# Patient Record
Sex: Female | Born: 1980
Health system: Southern US, Community
[De-identification: ages and names within clinical notes are randomized; demographics above are authoritative.]

## PROBLEM LIST (undated history)

## (undated) DIAGNOSIS — F329 Major depressive disorder, single episode, unspecified: Secondary | ICD-10-CM

## (undated) DIAGNOSIS — D649 Anemia, unspecified: Secondary | ICD-10-CM

## (undated) DIAGNOSIS — G43909 Migraine, unspecified, not intractable, without status migrainosus: Secondary | ICD-10-CM

## (undated) DIAGNOSIS — Z8619 Personal history of other infectious and parasitic diseases: Secondary | ICD-10-CM

## (undated) DIAGNOSIS — F32A Depression, unspecified: Secondary | ICD-10-CM

## (undated) DIAGNOSIS — R51 Headache: Secondary | ICD-10-CM

## (undated) HISTORY — PX: WISDOM TOOTH EXTRACTION: SHX21

## (undated) HISTORY — DX: Migraine, unspecified, not intractable, without status migrainosus: G43.909

## (undated) HISTORY — DX: Anemia, unspecified: D64.9

## (undated) HISTORY — DX: Personal history of other infectious and parasitic diseases: Z86.19

## (undated) HISTORY — DX: Headache: R51

## (undated) HISTORY — PX: MOLE REMOVAL: SHX2046

---
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
## 1998-03-03 ENCOUNTER — Other Ambulatory Visit: Admission: RE | Admit: 1998-03-03 | Discharge: 1998-03-03 | Payer: Self-pay

## 2002-12-22 ENCOUNTER — Ambulatory Visit (HOSPITAL_COMMUNITY): Admission: RE | Admit: 2002-12-22 | Discharge: 2002-12-22 | Payer: Self-pay | Admitting: Plastic Surgery

## 2002-12-22 ENCOUNTER — Ambulatory Visit (HOSPITAL_BASED_OUTPATIENT_CLINIC_OR_DEPARTMENT_OTHER): Admission: RE | Admit: 2002-12-22 | Discharge: 2002-12-22 | Payer: Self-pay | Admitting: Plastic Surgery

## 2002-12-22 ENCOUNTER — Encounter (INDEPENDENT_AMBULATORY_CARE_PROVIDER_SITE_OTHER): Payer: Self-pay | Admitting: Specialist

## 2003-04-17 ENCOUNTER — Ambulatory Visit (HOSPITAL_COMMUNITY): Admission: RE | Admit: 2003-04-17 | Discharge: 2003-04-17 | Payer: Self-pay | Admitting: Family Medicine

## 2005-01-12 ENCOUNTER — Other Ambulatory Visit: Admission: RE | Admit: 2005-01-12 | Discharge: 2005-01-12 | Payer: Self-pay | Admitting: Obstetrics and Gynecology

## 2005-01-23 ENCOUNTER — Ambulatory Visit (HOSPITAL_COMMUNITY): Admission: RE | Admit: 2005-01-23 | Discharge: 2005-01-23 | Payer: Self-pay | Admitting: Internal Medicine

## 2006-03-30 ENCOUNTER — Ambulatory Visit: Payer: Self-pay | Admitting: Internal Medicine

## 2006-04-23 ENCOUNTER — Encounter: Payer: Self-pay | Admitting: Cardiology

## 2006-04-23 ENCOUNTER — Ambulatory Visit: Payer: Self-pay

## 2006-04-23 ENCOUNTER — Ambulatory Visit: Payer: Self-pay | Admitting: Internal Medicine

## 2007-07-02 ENCOUNTER — Ambulatory Visit (HOSPITAL_BASED_OUTPATIENT_CLINIC_OR_DEPARTMENT_OTHER): Admission: RE | Admit: 2007-07-02 | Discharge: 2007-07-02 | Payer: Self-pay | Admitting: Plastic Surgery

## 2007-07-02 ENCOUNTER — Encounter (INDEPENDENT_AMBULATORY_CARE_PROVIDER_SITE_OTHER): Payer: Self-pay | Admitting: Plastic Surgery

## 2007-11-02 ENCOUNTER — Emergency Department (HOSPITAL_COMMUNITY): Admission: EM | Admit: 2007-11-02 | Discharge: 2007-11-02 | Payer: Self-pay | Admitting: Family Medicine

## 2007-11-18 ENCOUNTER — Inpatient Hospital Stay (HOSPITAL_COMMUNITY): Admission: AD | Admit: 2007-11-18 | Discharge: 2007-11-21 | Payer: Self-pay | Admitting: Obstetrics and Gynecology

## 2008-11-14 ENCOUNTER — Emergency Department (HOSPITAL_COMMUNITY): Admission: EM | Admit: 2008-11-14 | Discharge: 2008-11-15 | Payer: Self-pay | Admitting: Emergency Medicine

## 2009-05-05 ENCOUNTER — Ambulatory Visit (HOSPITAL_BASED_OUTPATIENT_CLINIC_OR_DEPARTMENT_OTHER): Admission: RE | Admit: 2009-05-05 | Discharge: 2009-05-05 | Payer: Self-pay | Admitting: Plastic Surgery

## 2009-06-15 ENCOUNTER — Ambulatory Visit (HOSPITAL_BASED_OUTPATIENT_CLINIC_OR_DEPARTMENT_OTHER): Admission: RE | Admit: 2009-06-15 | Discharge: 2009-06-15 | Payer: Self-pay | Admitting: Plastic Surgery

## 2010-08-05 NOTE — Op Note (Signed)
NAME:  Morgan Garrett, Morgan Garrett                          ACCOUNT NO.:  [id number]   MEDICAL RECORD NO.:  [id number]                   PATIENT TYPE:  AMB   LOCATION:  DSC                                  FACILITY:  [facility]   PHYSICIAN:  Brantley Persons, M.D.             DATE OF BIRTH:  [birth year]   DATE OF PROCEDURE:  12/22/2002  DATE OF DISCHARGE:                                 OPERATIVE REPORT   PREOPERATIVE DIAGNOSIS:  Suspicious skin lesion, right sternum.   POSTOPERATIVE DIAGNOSIS:  Suspicious skin lesion, right sternum.   OPERATION PERFORMED:  1. Excision of 1.2 cm suspicious skin lesion, right sternum.  2. Intermediate closure of 2.7 cm right sternal incision.   SURGEON:  Brantley Persons, M.D.   ANESTHESIA:  1% lidocaine with epinephrine.   COMPLICATIONS:  None.   INDICATIONS FOR PROCEDURE:  The patient is a 30 year old Caucasian female  who has been referred by Dr. Elmon Else for evaluation of a right sternal  skin lesion.  This skin lesion has an irregular border as well as  coloration.  I agreed that the patient should undergo an excisional biopsy.  She therefore presents to undergo that excision.   DESCRIPTION OF PROCEDURE:  The patient was brought back into the minor room  and placed on the table in the supine position.  The sternal area was then  prepped with Betadine and draped in sterile fashion.  The skin and  subcutaneous tissues in the area of the suspicious skin lesion were injected  with 1% lidocaine with epinephrine.  After adequate hemostasis and  anesthesia had taken effect, the procedure was begun.  Using loupe  magnification, the borders of the suspicious skin lesion were identified.  At least 1 mm margins were marked circumferentially around the skin lesion.  The skin lesion was then excised full thickness through the skin into the  subcutaneous tissue with a knife.  The specimen was marked at the 12 o'clock  position and passed off the table to undergo  permanent pathologic section  evaluation.  The skin edges were then undermined for easier closure.  An  intermediate closure was performed.  The deeper subcutaneous tissues were  closed with 3-0 Monocryl suture.  Deep dermal layer was then also closed  with 3-0 Monocryl suture.  The skin was then closed with a 4-0 Monocryl  running intracuticular stitch on the skin.  The incision was dressed with  Steri-Strips.  There were no complications.  The patient tolerated the  procedure well.  She was then taught proper postoperative wound care  instructions and discharged home in stable condition.  Follow-up appointment  will be tomorrow in the office.  Brantley Persons, M.D.    MC/MEDQ  D:  12/22/2002  T:  12/23/2002  Job:  [id number]

## 2010-08-05 NOTE — Procedures (Signed)
[name]                              EXERCISE TREADMILL   GUISELLE, MIAN                       MRN:          [id number]  DATE:04/23/2006                            DOB:          [birth year]    REFERRING PHYSICIAN:  Kari Baars, M.D.   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  She had an  echocardiogram today which showed a normal LV function with an ejection  fraction of 65%, no valvular abnormalities.  There was no mitral valve  prolapse.   RESTING DATA:  EKG showed a normal sinus rhythm at a rate of 96 with no  ST-T wave abnormalities.   EXERCISE DATA:  Patient exercised for 10 minutes and 30 seconds on a  standard Bruce protocol.  She stopped the test due to fatigue and  dyspnea, there was no chest pain.  Peak workload was 12.5 METS.  Peak  heart rate was 196, which is 101% of age predicted maximal heart rate.  There was no ST-T wave changes with exercise.  Resting blood pressure  went from 112/70 to 162/65.  Of note, she did have quite an abrupt  increase in her heart rate with the initiation of exercise, going from  96 at rest to 121 in the first 30 seconds of exercise.   CONCLUSION:  1. Normal exercise treadmill test.  2. Brisk heart rate increase with exercise which is within normal      limits.  3. I suspect she is mildly deconditioned and suggested that she      continue with her running program and also discussed with her a      possible small amount of interval training.  I also suspect that      she is a person who typically has a high sympathetic tone and will      likely always have a brisk heart rate response to exercise.   She will follow back up with [place] on a p.r.n. basis.     Bevelyn Buckles. Bensimhon, MD  Electronically Signed    DRB/MedQ  DD: 04/23/2006  DT: 04/23/2006   Job #: [id number]   cc:   Kari Baars, M.D.

## 2010-08-05 NOTE — Assessment & Plan Note (Signed)
[facility]                            CARDIOLOGY OFFICE NOTE   NAIARA, Garrett                       MRN:          [id number]  DATE:03/30/2006                            DOB:          06/05/1980    REASON FOR CONSULT:  Exercise-related tachycardia.   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  She really has no significant  past medical history except for some mild depression.  She does have a  sister who had a history of Wolff-Parkinson-White syndrome and underwent  ablation.  There is no other family history of cardiac disease.  She  tells me that she has always noted that her heart rate has been faster  than most people when exercising, but as a kid she never had problems  keeping up with other kids or any other cardiopulmonary problems.  Over  the past few months she has realized that her heart rate has been faster  than normal and this feels somewhat uncomfortable, and she often feels  like her heart is bounding.  There is no necessarily chest pain or  shortness of breath with this.  She does teach water aerobics.  She used  to run and do the elliptical but stopped this a few months ago because  she thought her heart rate was getting worse.  She also stopped her  Wellbutrin to see if this would help and there was no effect.  Additionally, she also cut back on her caffeine, though she was never  really a heavy caffeine drinker, and this has not really helped either.  She denies any syncope or presyncope.  She was told that she had a heart  murmur as a child but told that this was okay.  She saw Dr. Clelia Croft  yesterday who checked a TSH and the result is pending.  She is referred  here for further evaluation.   She denies any overwhelming stress but is currently planning her  wedding.   REVIEW OF SYSTEMS:  She does note some fatigue and anemia, as well as  depression and  migraines.  She denies the possibility of being pregnant  as she is currently on her period.  The remainder of review of systems  is negative except for HPI and problem list.   PAST MEDICAL HISTORY:  1. History of heart murmur as a child, thought to be benign.  2. Migraine headaches.  3. Depression.   CURRENT MEDICATIONS:  Relpax for her migraines.   SOCIAL HISTORY:  She works as an Mining engineer at [employer].  She is  single, in the process of getting married.  Denies any tobacco.  Alcohol  use only occasionally.   FAMILY HISTORY:  Mother is 85 and alive and well.  Father 76, alive and  well.  Sister 20 is well.  Does have a history of Wolff-Parkinson-White  status post ablation at 44 years old.  Brother is 32 and well.   PHYSICAL EXAMINATION:  GENERAL:  She is a well-appearing woman in no  acute  distress.  Ambulates around the clinic without any respiratory  difficulty.  VITAL SIGNS:  Blood pressure is 106/74, heart rate 77, her weight is  143.  HEENT:  Sclerae anicteric, EOMI.  There is no xanthelasma.  Mucous  membranes are moist.  NECK:  Supple.  No JVD.  Carotids are 2+ bilaterally without bruits.  There is no lymphadenopathy or thyromegaly.  CARDIAC:  She has a regular rate and rhythm with a soft flow murmur  across her LV outflow tract.  There is no change with maneuvers.  There  is no rub or gallop.  LUNGS:  Clear.  ABDOMEN:  Soft, nontender, nondistended.  No hepatosplenomegaly, no  bruits, no masses.  EXTREMITIES:  Warm with no cyanosis, clubbing or edema.  Good distal  pulses.  There are no rashes or arthropathies.  NEUROLOGIC:  She is alert and oriented x3.  Cranial nerves II-XII are  intact.  Moves all four extremities without difficulty.  Affect is  bright.   EKG shows normal sinus rhythm with sinus arrhythmia at a rate of 77.  Her PR interval is 126 milliseconds, QRS duration is 82 milliseconds, QT  interval is normal.  There is no evidence of  preexcitation.   ASSESSMENT AND PLAN:  Exercise-induced tachycardia.  I suspect this is  just a normal variant and not pathologic.  However, I do think it is  reasonable to perform an echocardiogram to make sure her LV function and  structure is normal.  We will also put her on the treadmill and evaluate  her heart rate response.  Should there be any remaining question, I do  think it would be reasonable to place a monitor to further evaluate.  We  will await her TSH results.   DISPOSITION:  Pending the results of her initial testing.     Bevelyn Buckles. Bensimhon, MD  Electronically Signed    DRB/MedQ  DD: 03/30/2006  DT: 03/30/2006  Job #: [id number]   cc:   Kari Baars, M.D.

## 2010-12-21 LAB — CBC
HCT: 35.5 — ABNORMAL LOW
HCT: 37.3
Hemoglobin: 12.2
Hemoglobin: 12.9
MCHC: 34.4
MCHC: 34.5
MCV: 90.6
MCV: 92.1
Platelets: 134 — ABNORMAL LOW
Platelets: 183
RBC: 3.86 — ABNORMAL LOW
RBC: 4.12
RDW: 11.8
RDW: 12
WBC: 10.2
WBC: 10.9 — ABNORMAL HIGH

## 2010-12-21 LAB — RPR: RPR Ser Ql: NONREACTIVE

## 2011-05-16 ENCOUNTER — Other Ambulatory Visit: Payer: Self-pay | Admitting: Obstetrics and Gynecology

## 2011-08-18 LAB — OB RESULTS CONSOLE ANTIBODY SCREEN: Antibody Screen: NEGATIVE

## 2011-08-18 LAB — OB RESULTS CONSOLE GC/CHLAMYDIA
Chlamydia: NEGATIVE
Gonorrhea: NEGATIVE

## 2011-08-18 LAB — OB RESULTS CONSOLE RPR: RPR: NONREACTIVE

## 2011-08-18 LAB — OB RESULTS CONSOLE ABO/RH: RH Type: NEGATIVE

## 2011-08-18 LAB — OB RESULTS CONSOLE HEPATITIS B SURFACE ANTIGEN: Hepatitis B Surface Ag: NEGATIVE

## 2011-08-18 LAB — OB RESULTS CONSOLE HIV ANTIBODY (ROUTINE TESTING): HIV: NONREACTIVE

## 2011-08-18 LAB — OB RESULTS CONSOLE RUBELLA ANTIBODY, IGM: Rubella: IMMUNE

## 2012-02-19 LAB — OB RESULTS CONSOLE GBS: GBS: NEGATIVE

## 2012-03-08 ENCOUNTER — Telehealth (HOSPITAL_COMMUNITY): Payer: Self-pay | Admitting: *Deleted

## 2012-03-08 ENCOUNTER — Encounter (HOSPITAL_COMMUNITY): Payer: Self-pay | Admitting: *Deleted

## 2012-03-08 NOTE — Telephone Encounter (Signed)
Preadmission screen  

## 2012-03-10 ENCOUNTER — Inpatient Hospital Stay (HOSPITAL_COMMUNITY): Payer: 59 | Admitting: Anesthesiology

## 2012-03-10 ENCOUNTER — Encounter (HOSPITAL_COMMUNITY): Payer: Self-pay | Admitting: *Deleted

## 2012-03-10 ENCOUNTER — Encounter (HOSPITAL_COMMUNITY): Payer: Self-pay | Admitting: Anesthesiology

## 2012-03-10 ENCOUNTER — Inpatient Hospital Stay (HOSPITAL_COMMUNITY)
Admission: AD | Admit: 2012-03-10 | Discharge: 2012-03-12 | DRG: 775 | Disposition: A | Discharge status: Home / Self Care | Payer: 59 | Source: Ambulatory Visit | Attending: Obstetrics and Gynecology | Admitting: Obstetrics and Gynecology

## 2012-03-10 DIAGNOSIS — O99893 Other specified diseases and conditions complicating puerperium: Principal | ICD-10-CM | POA: Diagnosis not present

## 2012-03-10 DIAGNOSIS — R209 Unspecified disturbances of skin sensation: Secondary | ICD-10-CM | POA: Diagnosis not present

## 2012-03-10 HISTORY — DX: Major depressive disorder, single episode, unspecified: F32.9

## 2012-03-10 HISTORY — DX: Depression, unspecified: F32.A

## 2012-03-10 LAB — CBC
HCT: 37.8 % (ref 36.0–46.0)
MCHC: 34.7 g/dL (ref 30.0–36.0)
Platelets: 170 10*3/uL (ref 150–400)
RDW: 13 % (ref 11.5–15.5)
WBC: 13.4 10*3/uL — ABNORMAL HIGH (ref 4.0–10.5)

## 2012-03-10 MED ORDER — ZOLPIDEM TARTRATE 5 MG PO TABS: 5.0000 mg | ORAL_TABLET | Freq: Every evening | ORAL | Status: DC | PRN

## 2012-03-10 MED ORDER — FENTANYL 2.5 MCG/ML BUPIVACAINE 1/10 % EPIDURAL INFUSION (WH - ANES)
14.0000 mL/h | INTRAMUSCULAR | Status: DC
  Administered 2012-03-10: 14 mL/h via EPIDURAL
  Filled 2012-03-10: qty 125

## 2012-03-10 MED ORDER — ACETAMINOPHEN 325 MG PO TABS: 650.0000 mg | ORAL_TABLET | ORAL | Status: DC | PRN

## 2012-03-10 MED ORDER — OXYCODONE-ACETAMINOPHEN 5-325 MG PO TABS: 1.0000 | ORAL_TABLET | ORAL | Status: DC | PRN

## 2012-03-10 MED ORDER — ONDANSETRON HCL 4 MG PO TABS: 4.0000 mg | ORAL_TABLET | ORAL | Status: DC | PRN

## 2012-03-10 MED ORDER — OXYCODONE-ACETAMINOPHEN 5-325 MG PO TABS
1.0000 | ORAL_TABLET | ORAL | Status: DC | PRN
  Administered 2012-03-11: 1 via ORAL
  Filled 2012-03-10: qty 1

## 2012-03-10 MED ORDER — PHENYLEPHRINE 40 MCG/ML (10ML) SYRINGE FOR IV PUSH (FOR BLOOD PRESSURE SUPPORT)
80.0000 ug | PREFILLED_SYRINGE | INTRAVENOUS | Status: AC | PRN
  Administered 2012-03-10: 120 ug via INTRAVENOUS
  Administered 2012-03-10 (×2): 80 ug via INTRAVENOUS
  Filled 2012-03-10 (×2): qty 5

## 2012-03-10 MED ORDER — SODIUM BICARBONATE 8.4 % IV SOLN
INTRAVENOUS | Status: DC | PRN
  Administered 2012-03-10: 5 mL via EPIDURAL

## 2012-03-10 MED ORDER — TETANUS-DIPHTH-ACELL PERTUSSIS 5-2.5-18.5 LF-MCG/0.5 IM SUSP: 0.5000 mL | Freq: Once | INTRAMUSCULAR | Status: DC

## 2012-03-10 MED ORDER — FLEET ENEMA 7-19 GM/118ML RE ENEM: 1.0000 | ENEMA | Freq: Every day | RECTAL | Status: DC | PRN

## 2012-03-10 MED ORDER — BENZOCAINE-MENTHOL 20-0.5 % EX AERO
1.0000 "application " | INHALATION_SPRAY | CUTANEOUS | Status: DC | PRN
  Administered 2012-03-10: 1 via TOPICAL
  Filled 2012-03-10: qty 56

## 2012-03-10 MED ORDER — DIPHENHYDRAMINE HCL 25 MG PO CAPS
25.0000 mg | ORAL_CAPSULE | Freq: Four times a day (QID) | ORAL | Status: DC | PRN
Start: 2012-03-10 — End: 2012-03-12

## 2012-03-10 MED ORDER — EPHEDRINE 5 MG/ML INJ
10.0000 mg | INTRAVENOUS | Status: DC | PRN
  Filled 2012-03-10: qty 4

## 2012-03-10 MED ORDER — CITRIC ACID-SODIUM CITRATE 334-500 MG/5ML PO SOLN: 30.0000 mL | ORAL | Status: DC | PRN

## 2012-03-10 MED ORDER — LACTATED RINGERS IV SOLN
500.0000 mL | Freq: Once | INTRAVENOUS | Status: AC
  Administered 2012-03-10: 500 mL via INTRAVENOUS

## 2012-03-10 MED ORDER — EPHEDRINE 5 MG/ML INJ: 10.0000 mg | INTRAVENOUS | Status: DC | PRN

## 2012-03-10 MED ORDER — DIPHENHYDRAMINE HCL 50 MG/ML IJ SOLN: 12.5000 mg | INTRAMUSCULAR | Status: DC | PRN

## 2012-03-10 MED ORDER — FLEET ENEMA 7-19 GM/118ML RE ENEM: 1.0000 | ENEMA | RECTAL | Status: DC | PRN

## 2012-03-10 MED ORDER — OXYTOCIN 40 UNITS IN LACTATED RINGERS INFUSION - SIMPLE MED
62.5000 mL/h | INTRAVENOUS | Status: DC
  Administered 2012-03-10: 62.5 mL/h via INTRAVENOUS
  Filled 2012-03-10: qty 1000

## 2012-03-10 MED ORDER — ONDANSETRON HCL 4 MG/2ML IJ SOLN
4.0000 mg | Freq: Four times a day (QID) | INTRAMUSCULAR | Status: DC | PRN
  Filled 2012-03-10 (×2): qty 2

## 2012-03-10 MED ORDER — DIBUCAINE 1 % RE OINT
1.0000 "application " | TOPICAL_OINTMENT | RECTAL | Status: DC | PRN
  Administered 2012-03-11: 1 via RECTAL
  Filled 2012-03-10: qty 28

## 2012-03-10 MED ORDER — IBUPROFEN 600 MG PO TABS: 600.0000 mg | ORAL_TABLET | Freq: Four times a day (QID) | ORAL | Status: DC | PRN

## 2012-03-10 MED ORDER — LANOLIN HYDROUS EX OINT: TOPICAL_OINTMENT | CUTANEOUS | Status: DC | PRN

## 2012-03-10 MED ORDER — IBUPROFEN 600 MG PO TABS
600.0000 mg | ORAL_TABLET | Freq: Four times a day (QID) | ORAL | Status: DC
  Administered 2012-03-10 – 2012-03-12 (×6): 600 mg via ORAL
  Filled 2012-03-10 (×7): qty 1

## 2012-03-10 MED ORDER — OXYTOCIN BOLUS FROM INFUSION: 500.0000 mL | INTRAVENOUS | Status: DC

## 2012-03-10 MED ORDER — SIMETHICONE 80 MG PO CHEW: 80.0000 mg | CHEWABLE_TABLET | ORAL | Status: DC | PRN

## 2012-03-10 MED ORDER — WITCH HAZEL-GLYCERIN EX PADS
1.0000 "application " | MEDICATED_PAD | CUTANEOUS | Status: DC | PRN
  Administered 2012-03-11: 1 via TOPICAL

## 2012-03-10 MED ORDER — LACTATED RINGERS IV SOLN: 500.0000 mL | INTRAVENOUS | Status: DC | PRN

## 2012-03-10 MED ORDER — BISACODYL 10 MG RE SUPP: 10.0000 mg | Freq: Every day | RECTAL | Status: DC | PRN

## 2012-03-10 MED ORDER — LIDOCAINE HCL (PF) 1 % IJ SOLN
30.0000 mL | INTRAMUSCULAR | Status: DC | PRN
  Filled 2012-03-10: qty 30

## 2012-03-10 MED ORDER — PHENYLEPHRINE 40 MCG/ML (10ML) SYRINGE FOR IV PUSH (FOR BLOOD PRESSURE SUPPORT): 80.0000 ug | PREFILLED_SYRINGE | INTRAVENOUS | Status: DC | PRN

## 2012-03-10 MED ORDER — SENNOSIDES-DOCUSATE SODIUM 8.6-50 MG PO TABS
2.0000 | ORAL_TABLET | Freq: Every day | ORAL | Status: DC
  Administered 2012-03-10 – 2012-03-11 (×2): 2 via ORAL

## 2012-03-10 MED ORDER — PRENATAL MULTIVITAMIN CH
1.0000 | ORAL_TABLET | Freq: Every day | ORAL | Status: DC
  Administered 2012-03-10 – 2012-03-12 (×3): 1 via ORAL
  Filled 2012-03-10 (×3): qty 1

## 2012-03-10 MED ORDER — LACTATED RINGERS IV SOLN
INTRAVENOUS | Status: DC
  Administered 2012-03-10 (×2): via INTRAVENOUS

## 2012-03-10 MED ORDER — ONDANSETRON HCL 4 MG/2ML IJ SOLN: 4.0000 mg | INTRAMUSCULAR | Status: DC | PRN

## 2012-03-10 NOTE — Progress Notes (Signed)
Delivery Note At 5:47 PM a viable female was delivered via  (Presentation:LOA ;  ).  APGAR:9 ,10 ; weight pending .   Placenta status:3 vessels-intact , .  Cord: 3 vessel  with the following complications: .  Cord pH: pending  Anesthesia:  epidural Episiotomy: none   Lacerations: bilat periurethral lacs not bleeding, not repaired Suture Repair: none Est. Blood Loss (mL):   Mom to postpartum.  Baby to nursery-stable.  Chaitra Mast II,Kendarius Vigen E 03/10/2012, 5:58 PM

## 2012-03-10 NOTE — Progress Notes (Addendum)
Pt continues to report feeling "too numb" and desires the epidural to remain off--anesthesia notified--orders to reassess pt pain in 15-20 mins

## 2012-03-10 NOTE — MAU Note (Signed)
"  I started having UC's about 0630.  Then around 0845 I passed a blood clot in the toilet that scared me.  I was seen in the MD's office on Thursday.  Dr. Vincente Poli checked me and said I was 3-4 cm/80% and she stripped my membranes.  Every since then I have been having UC's off and on.  UC's are now about every 5-10 mins.  (+) FM.  No LOF."

## 2012-03-10 NOTE — Progress Notes (Signed)
Anesthesia at bedside--epidural restarted

## 2012-03-10 NOTE — Progress Notes (Signed)
Cx C/C/-1   Arom clear Now 0 station FHT reactive

## 2012-03-10 NOTE — Progress Notes (Addendum)
Call anesthesia to inform that pt reports feeling "too numb" and wants the epidural "decreased"--orders from Dr. Sherron Ales to turn off epidural and he would turn back on in 30 mins--epidural off

## 2012-03-10 NOTE — Anesthesia Preprocedure Evaluation (Signed)

## 2012-03-10 NOTE — Anesthesia Procedure Notes (Signed)

## 2012-03-10 NOTE — H&P (Signed)
Morgan Garrett is a 31 y.o. female presenting for contractions. Has had cervical change in MAU. No HA, no vision change, no epigastric pain. No ROM. Maternal Medical History:  Reason for admission: Reason for admission: contractions.  Contractions: Onset was 3-5 hours ago.    Fetal activity: Perceived fetal activity is normal.      OB History    Grav Para Term Preterm Abortions TAB SAB Ect Mult Living   3 1 1  0 1 1 0 0  1     Past Medical History  Diagnosis Date  . H/O varicella   . Anemia     hx  . Headache     migraines  . Depression    Past Surgical History  Procedure Date  . Mole removal    Family History: family history includes Alzheimer's disease in her maternal grandfather; Cancer in her maternal grandmother and sister; Hypertension in her brother; Stroke in her maternal grandmother; and Phillips Odor White syndrome in her sister. Social History:  reports that she has never smoked. She has never used smokeless tobacco. She reports that she does not drink alcohol or use illicit drugs.   Prenatal Transfer Tool  Maternal Diabetes: No Genetic Screening: Normal Maternal Ultrasounds/Referrals: Normal Fetal Ultrasounds or other Referrals:  None Maternal Substance Abuse:  No Significant Maternal Medications:  None Significant Maternal Lab Results:  None Other Comments:  None  Review of Systems  Eyes: Negative for blurred vision.  Gastrointestinal: Negative for abdominal pain.  Neurological: Negative for headaches.    Dilation: 6 Effacement (%): 100 Station: -2 Exam by:: Raelyn Mora, RN Blood pressure 119/80, pulse 105, temperature 98.1 F (36.7 C), temperature source Oral, resp. rate 18, height 5\' 4"  (1.626 m), weight 186 lb 9.6 oz (84.641 kg). Maternal Exam:  Uterine Assessment: Contraction strength is moderate.  Contraction frequency is regular.      Fetal Exam Fetal Monitor Review: Pattern: accelerations present.       Physical Exam   Cardiovascular: Normal rate and regular rhythm.   Respiratory: Effort normal and breath sounds normal.  GI: There is no tenderness.  Neurological: She has normal reflexes.    Prenatal labs: ABO, Rh: O/Negative/-- (05/31 0000) Antibody: Negative (05/31 0000) Rubella: Immune (05/31 0000) RPR: Nonreactive (05/31 0000)  HBsAg: Negative (05/31 0000)  HIV: Non-reactive (05/31 0000)  GBS: Negative (12/02 0000)   Assessment/Plan: 31 yo G3P1 in active labor. Epidural prn.   Morgan Garrett,Morgan Garrett 03/10/2012, 2:44 PM

## 2012-03-10 NOTE — MAU Note (Signed)
Pt reports haviing ctx on and off since 6. Reports seeing a blood clot no fluid leaking. Good fetal movement reported.

## 2012-03-11 ENCOUNTER — Inpatient Hospital Stay (HOSPITAL_COMMUNITY): Payer: 59

## 2012-03-11 LAB — CBC
Platelets: 151 10*3/uL (ref 150–400)
RBC: 3.9 MIL/uL (ref 3.87–5.11)
RDW: 13.2 % (ref 11.5–15.5)
WBC: 14.4 10*3/uL — ABNORMAL HIGH (ref 4.0–10.5)

## 2012-03-11 LAB — RPR: RPR Ser Ql: NONREACTIVE

## 2012-03-11 MED ORDER — RHO D IMMUNE GLOBULIN 1500 UNIT/2ML IJ SOLN
300.0000 ug | Freq: Once | INTRAMUSCULAR | Status: AC
  Administered 2012-03-11: 300 ug via INTRAMUSCULAR
  Filled 2012-03-11: qty 2

## 2012-03-11 NOTE — Anesthesia Postprocedure Evaluation (Signed)
  Anesthesia Post-op Note  Patient: Morgan Garrett  Procedure(s) Performed: * No procedures listed *  Patient Location: Mother/Baby  Anesthesia Type:Epidural  Level of Consciousness: awake, alert  and oriented  Airway and Oxygen Therapy: Patient Spontanous Breathing  Post-op Pain: mild  Post-op Assessment: Post-op Vital signs reviewed, Patient's Cardiovascular Status Stable, No headache, No backache, No residual numbness and No residual motor weakness  Post-op Vital Signs: Reviewed and stable  Complications: No apparent anesthesia complications

## 2012-03-11 NOTE — Progress Notes (Signed)
Post Partum Day one Subjective: no complaints  Objective: Blood pressure 100/64, pulse 92, temperature 98.1 F (36.7 C), temperature source Oral, resp. rate 18, height 5\' 4"  (1.626 m), weight 84.641 kg (186 lb 9.6 oz), SpO2 98.00%, unknown if currently breastfeeding.  Physical Exam:  General: alert Lochia: appropriate Uterine Fundus: firm Incision: healing well DVT Evaluation: No evidence of DVT seen on physical exam.   Basename 03/11/12 0500 03/10/12 1415  HGB 11.8* 13.1  HCT 34.6* 37.8    Assessment/Plan: Plan for discharge tomorrow   LOS: 1 day   Morgan Garrett S 03/11/2012, 8:03 AM

## 2012-03-12 LAB — RH IG WORKUP (INCLUDES ABO/RH)
Antibody Screen: NEGATIVE
Fetal Screen: NEGATIVE
Gestational Age(Wks): 39.2
Unit division: 0

## 2012-03-12 MED ORDER — IBUPROFEN 600 MG PO TABS: 600.0000 mg | ORAL_TABLET | Freq: Four times a day (QID) | ORAL | Status: DC

## 2012-03-12 MED ORDER — OXYCODONE-ACETAMINOPHEN 5-325 MG PO TABS: 1.0000 | ORAL_TABLET | ORAL | Status: DC | PRN

## 2012-03-12 NOTE — Discharge Summary (Signed)
Obstetric Discharge Summary Reason for Admission: onset of labor Prenatal Procedures: ultrasound Intrapartum Procedures: spontaneous vaginal delivery Postpartum Procedures: none Complications-Operative and Postpartum: none Hemoglobin  Date Value Range Status  03/11/2012 11.8* 12.0 - 15.0 g/dL Final     HCT  Date Value Range Status  03/11/2012 34.6* 36.0 - 46.0 % Final    Physical Exam:  General: alert and cooperative Lochia: appropriate Uterine Fundus: firm Incision: healing well, small hemorrhoid DVT Evaluation: No evidence of DVT seen on physical exam. Negative Homan's sign. No cords or calf tenderness. No significant calf/ankle edema.  Discharge Diagnoses: Term Pregnancy-delivered  Discharge Information: Date: 03/12/2012 Activity: pelvic rest Diet: routine Medications: PNV, Ibuprofen and Percocet Condition: stable Instructions: refer to practice specific booklet Discharge to: home   Newborn Data: Live born female  Birth Weight: 7 lb 2.4 oz (3243 g) APGAR: 9, 10  Home with mother.  CURTIS,CAROL G 03/12/2012, 8:15 AM

## 2012-03-14 ENCOUNTER — Inpatient Hospital Stay (HOSPITAL_COMMUNITY): Admission: RE | Admit: 2012-03-14 | Payer: 59 | Source: Ambulatory Visit

## 2012-04-17 ENCOUNTER — Other Ambulatory Visit: Payer: Self-pay | Admitting: Obstetrics and Gynecology

## 2012-05-04 ENCOUNTER — Other Ambulatory Visit: Payer: Self-pay

## 2012-06-04 ENCOUNTER — Other Ambulatory Visit: Payer: Self-pay | Admitting: Dermatology

## 2012-07-16 ENCOUNTER — Other Ambulatory Visit: Payer: Self-pay | Admitting: Dermatology

## 2013-01-23 ENCOUNTER — Other Ambulatory Visit: Payer: Self-pay

## 2013-04-28 ENCOUNTER — Other Ambulatory Visit: Payer: Self-pay | Admitting: Obstetrics and Gynecology

## 2013-07-03 ENCOUNTER — Other Ambulatory Visit: Payer: Self-pay | Admitting: Dermatology

## 2013-10-13 ENCOUNTER — Other Ambulatory Visit: Payer: Self-pay | Admitting: Obstetrics and Gynecology

## 2013-10-13 DIAGNOSIS — N6310 Unspecified lump in the right breast, unspecified quadrant: Principal | ICD-10-CM

## 2013-10-13 DIAGNOSIS — N6315 Unspecified lump in the right breast, overlapping quadrants: Secondary | ICD-10-CM

## 2013-10-14 ENCOUNTER — Ambulatory Visit
Admission: RE | Admit: 2013-10-14 | Discharge: 2013-10-14 | Disposition: A | Discharge status: Home / Self Care | Payer: 59 | Source: Ambulatory Visit | Attending: Obstetrics and Gynecology | Admitting: Obstetrics and Gynecology

## 2013-10-14 DIAGNOSIS — N6315 Unspecified lump in the right breast, overlapping quadrants: Secondary | ICD-10-CM

## 2013-10-14 DIAGNOSIS — N6310 Unspecified lump in the right breast, unspecified quadrant: Principal | ICD-10-CM

## 2014-01-02 ENCOUNTER — Other Ambulatory Visit: Payer: Self-pay

## 2014-01-19 ENCOUNTER — Encounter (HOSPITAL_COMMUNITY): Payer: Self-pay | Admitting: *Deleted

## 2014-07-09 ENCOUNTER — Other Ambulatory Visit: Payer: Self-pay | Admitting: Obstetrics and Gynecology

## 2014-07-10 LAB — CYTOLOGY - PAP

## 2015-05-10 MED FILL — RIZATRIPTAN 10 MG TABLET: 10 | 30 days supply | Qty: 9 | Fill #1

## 2015-05-20 DIAGNOSIS — R0602 Shortness of breath: Secondary | ICD-10-CM | POA: Diagnosis not present

## 2015-05-20 DIAGNOSIS — R5381 Other malaise: Secondary | ICD-10-CM | POA: Diagnosis not present

## 2015-05-20 DIAGNOSIS — E782 Mixed hyperlipidemia: Secondary | ICD-10-CM | POA: Diagnosis not present

## 2015-05-20 DIAGNOSIS — R5383 Other fatigue: Secondary | ICD-10-CM | POA: Diagnosis not present

## 2015-05-20 DIAGNOSIS — E559 Vitamin D deficiency, unspecified: Secondary | ICD-10-CM | POA: Diagnosis not present

## 2015-05-20 DIAGNOSIS — R635 Abnormal weight gain: Secondary | ICD-10-CM | POA: Diagnosis not present

## 2015-05-28 DIAGNOSIS — R635 Abnormal weight gain: Secondary | ICD-10-CM | POA: Diagnosis not present

## 2015-05-28 DIAGNOSIS — Z79899 Other long term (current) drug therapy: Secondary | ICD-10-CM | POA: Diagnosis not present

## 2015-05-28 MED FILL — PHENTERMINE 37.5 MG TABLET: 37.5 | 30 days supply | Qty: 30 | Fill #0

## 2015-07-05 DIAGNOSIS — Z79899 Other long term (current) drug therapy: Secondary | ICD-10-CM | POA: Diagnosis not present

## 2015-07-05 DIAGNOSIS — R635 Abnormal weight gain: Secondary | ICD-10-CM | POA: Diagnosis not present

## 2015-07-05 MED FILL — PHENTERMINE 37.5 MG TABLET: 37.5 | 30 days supply | Qty: 30 | Fill #0

## 2015-07-20 MED FILL — RIZATRIPTAN 10 MG TABLET: 10 | 90 days supply | Qty: 27 | Fill #0

## 2015-08-02 DIAGNOSIS — R6882 Decreased libido: Secondary | ICD-10-CM | POA: Diagnosis not present

## 2015-08-02 DIAGNOSIS — R635 Abnormal weight gain: Secondary | ICD-10-CM | POA: Diagnosis not present

## 2015-08-02 DIAGNOSIS — Z79899 Other long term (current) drug therapy: Secondary | ICD-10-CM | POA: Diagnosis not present

## 2015-08-02 DIAGNOSIS — Z01411 Encounter for gynecological examination (general) (routine) with abnormal findings: Secondary | ICD-10-CM | POA: Diagnosis not present

## 2015-08-02 DIAGNOSIS — Z6827 Body mass index (BMI) 27.0-27.9, adult: Secondary | ICD-10-CM | POA: Diagnosis not present

## 2015-08-02 DIAGNOSIS — Z01419 Encounter for gynecological examination (general) (routine) without abnormal findings: Secondary | ICD-10-CM | POA: Diagnosis not present

## 2015-08-05 MED FILL — PHENTERMINE 37.5 MG TABLET: 37.5 | 30 days supply | Qty: 30 | Fill #0

## 2015-08-20 DIAGNOSIS — G43909 Migraine, unspecified, not intractable, without status migrainosus: Secondary | ICD-10-CM | POA: Diagnosis not present

## 2015-08-30 DIAGNOSIS — N3000 Acute cystitis without hematuria: Secondary | ICD-10-CM | POA: Diagnosis not present

## 2015-08-30 DIAGNOSIS — R300 Dysuria: Secondary | ICD-10-CM | POA: Diagnosis not present

## 2015-08-30 MED FILL — SULFAMETHOXAZOLE-TMP DS TAB: 800-160 | 3 days supply | Qty: 6 | Fill #0

## 2015-09-06 DIAGNOSIS — Z79899 Other long term (current) drug therapy: Secondary | ICD-10-CM | POA: Diagnosis not present

## 2015-09-06 DIAGNOSIS — R635 Abnormal weight gain: Secondary | ICD-10-CM | POA: Diagnosis not present

## 2015-09-06 MED FILL — PHENTERMINE 37.5 MG TABLET: 37.5 | 30 days supply | Qty: 30 | Fill #0

## 2015-09-22 DIAGNOSIS — D224 Melanocytic nevi of scalp and neck: Secondary | ICD-10-CM | POA: Diagnosis not present

## 2015-09-22 DIAGNOSIS — Z86018 Personal history of other benign neoplasm: Secondary | ICD-10-CM | POA: Diagnosis not present

## 2015-09-22 DIAGNOSIS — D225 Melanocytic nevi of trunk: Secondary | ICD-10-CM | POA: Diagnosis not present

## 2015-09-22 DIAGNOSIS — D2230 Melanocytic nevi of unspecified part of face: Secondary | ICD-10-CM | POA: Diagnosis not present

## 2015-09-22 DIAGNOSIS — D2272 Melanocytic nevi of left lower limb, including hip: Secondary | ICD-10-CM | POA: Diagnosis not present

## 2015-10-04 DIAGNOSIS — Z79899 Other long term (current) drug therapy: Secondary | ICD-10-CM | POA: Diagnosis not present

## 2015-10-04 DIAGNOSIS — R635 Abnormal weight gain: Secondary | ICD-10-CM | POA: Diagnosis not present

## 2015-10-11 MED FILL — PHENTERMINE 37.5 MG TABLET: 37.5 | 30 days supply | Qty: 30 | Fill #0

## 2015-11-01 DIAGNOSIS — R635 Abnormal weight gain: Secondary | ICD-10-CM | POA: Diagnosis not present

## 2015-11-01 DIAGNOSIS — E782 Mixed hyperlipidemia: Secondary | ICD-10-CM | POA: Diagnosis not present

## 2015-11-01 DIAGNOSIS — Z79899 Other long term (current) drug therapy: Secondary | ICD-10-CM | POA: Diagnosis not present

## 2015-11-01 MED FILL — RIZATRIPTAN 10 MG TABLET: 10 | 90 days supply | Qty: 27 | Fill #0

## 2015-12-03 DIAGNOSIS — E782 Mixed hyperlipidemia: Secondary | ICD-10-CM | POA: Diagnosis not present

## 2015-12-03 DIAGNOSIS — R635 Abnormal weight gain: Secondary | ICD-10-CM | POA: Diagnosis not present

## 2015-12-03 DIAGNOSIS — E559 Vitamin D deficiency, unspecified: Secondary | ICD-10-CM | POA: Diagnosis not present

## 2016-02-09 ENCOUNTER — Telehealth: Payer: 59 | Admitting: Nurse Practitioner

## 2016-02-09 DIAGNOSIS — N3000 Acute cystitis without hematuria: Secondary | ICD-10-CM | POA: Diagnosis not present

## 2016-02-09 MED ORDER — NITROFURANTOIN MONOHYD MACRO 100 MG PO CAPS: 100.0000 mg | ORAL_CAPSULE | Freq: Two times a day (BID) | ORAL | 0 refills | Status: DC

## 2016-02-09 MED FILL — NITROFURANTOIN MONO-MCR 100: 100 | 7 days supply | Qty: 14 | Fill #0

## 2016-02-09 NOTE — Progress Notes (Signed)

## 2016-02-16 DIAGNOSIS — R635 Abnormal weight gain: Secondary | ICD-10-CM | POA: Diagnosis not present

## 2016-02-16 DIAGNOSIS — Z79899 Other long term (current) drug therapy: Secondary | ICD-10-CM | POA: Diagnosis not present

## 2016-02-16 MED FILL — PHENTERMINE 37.5 MG TABLET: 37.5 | 30 days supply | Qty: 30 | Fill #0

## 2016-02-22 MED FILL — RIZATRIPTAN 10 MG TABLET: 10 | 30 days supply | Qty: 9 | Fill #1

## 2016-03-28 DIAGNOSIS — J988 Other specified respiratory disorders: Secondary | ICD-10-CM | POA: Diagnosis not present

## 2016-04-18 DIAGNOSIS — G43909 Migraine, unspecified, not intractable, without status migrainosus: Secondary | ICD-10-CM | POA: Diagnosis not present

## 2016-04-18 DIAGNOSIS — J069 Acute upper respiratory infection, unspecified: Secondary | ICD-10-CM | POA: Diagnosis not present

## 2016-04-18 MED FILL — AMOX TR-K CLV 875-125 MG TA: 875-125 | 10 days supply | Qty: 20 | Fill #0

## 2016-04-18 MED FILL — RIZATRIPTAN 10 MG TABLET: 10 | 90 days supply | Qty: 27 | Fill #0

## 2016-04-18 MED FILL — PROMETHAZINE-DM SYRUP: 6.25-15 | 7 days supply | Qty: 140 | Fill #0

## 2016-05-30 ENCOUNTER — Telehealth: Payer: 59 | Admitting: Family

## 2016-05-30 DIAGNOSIS — N3000 Acute cystitis without hematuria: Secondary | ICD-10-CM

## 2016-05-30 MED ORDER — CIPROFLOXACIN HCL 500 MG PO TABS: 500.0000 mg | ORAL_TABLET | Freq: Two times a day (BID) | ORAL | 0 refills | Status: DC

## 2016-05-30 MED FILL — CIPROFLOXACIN HCL 500 MG TA: 500 | 5 days supply | Qty: 10 | Fill #0

## 2016-05-30 NOTE — Progress Notes (Signed)

## 2016-06-01 DIAGNOSIS — L659 Nonscarring hair loss, unspecified: Secondary | ICD-10-CM | POA: Diagnosis not present

## 2016-06-14 DIAGNOSIS — L649 Androgenic alopecia, unspecified: Secondary | ICD-10-CM | POA: Diagnosis not present

## 2016-06-14 DIAGNOSIS — Z23 Encounter for immunization: Secondary | ICD-10-CM | POA: Diagnosis not present

## 2016-06-14 DIAGNOSIS — L309 Dermatitis, unspecified: Secondary | ICD-10-CM | POA: Diagnosis not present

## 2016-06-14 MED FILL — FLUOCINONIDE 0.05% SOLUTION: 0.05 | 20 days supply | Qty: 60 | Fill #0

## 2016-06-14 MED FILL — VITAMIN D3 50000 UNIT CAPS: 1.25 MG | 56 days supply | Qty: 8 | Fill #0

## 2016-07-19 MED FILL — RIZATRIPTAN 10 MG TABLET: 10 | 30 days supply | Qty: 9 | Fill #1

## 2016-08-09 MED FILL — CROMOLYN 4% EYE DROPS: 4 | 7 days supply | Qty: 10 | Fill #0

## 2016-08-09 MED FILL — GENTAK 3 MG/ML EYE DROPS: 0.3 | 7 days supply | Qty: 5 | Fill #0

## 2016-08-23 DIAGNOSIS — Z01419 Encounter for gynecological examination (general) (routine) without abnormal findings: Secondary | ICD-10-CM | POA: Diagnosis not present

## 2016-08-23 DIAGNOSIS — Z6827 Body mass index (BMI) 27.0-27.9, adult: Secondary | ICD-10-CM | POA: Diagnosis not present

## 2016-09-28 DIAGNOSIS — D2230 Melanocytic nevi of unspecified part of face: Secondary | ICD-10-CM | POA: Diagnosis not present

## 2016-09-28 DIAGNOSIS — D225 Melanocytic nevi of trunk: Secondary | ICD-10-CM | POA: Diagnosis not present

## 2016-09-28 DIAGNOSIS — D224 Melanocytic nevi of scalp and neck: Secondary | ICD-10-CM | POA: Diagnosis not present

## 2016-09-28 DIAGNOSIS — Z86018 Personal history of other benign neoplasm: Secondary | ICD-10-CM | POA: Diagnosis not present

## 2016-09-28 DIAGNOSIS — D2272 Melanocytic nevi of left lower limb, including hip: Secondary | ICD-10-CM | POA: Diagnosis not present

## 2016-10-11 DIAGNOSIS — R112 Nausea with vomiting, unspecified: Secondary | ICD-10-CM | POA: Diagnosis not present

## 2016-10-11 DIAGNOSIS — G43909 Migraine, unspecified, not intractable, without status migrainosus: Secondary | ICD-10-CM | POA: Diagnosis not present

## 2016-10-11 DIAGNOSIS — M62838 Other muscle spasm: Secondary | ICD-10-CM | POA: Diagnosis not present

## 2016-10-11 MED FILL — CYCLOBENZAPRINE 5 MG TABLET: 5 | 10 days supply | Qty: 30 | Fill #0

## 2016-10-11 MED FILL — PROMETHAZINE 25 MG TABLET: 25 | 5 days supply | Qty: 10 | Fill #0

## 2016-10-11 MED FILL — RIZATRIPTAN 10 MG TABLET: 10 | 90 days supply | Qty: 36 | Fill #0

## 2016-10-11 MED FILL — BUTALB-ACETAMIN-CAFF 50-300: 50-300-40 | 5 days supply | Qty: 30 | Fill #0

## 2016-10-11 MED FILL — ONDANSETRON HCL 8 MG TAB: 8 | 5 days supply | Qty: 10 | Fill #0

## 2017-02-22 DIAGNOSIS — R197 Diarrhea, unspecified: Secondary | ICD-10-CM | POA: Diagnosis not present

## 2017-06-14 MED FILL — RIZATRIPTAN BENZOATE 10 MG: 10 | 30 days supply | Qty: 12 | Fill #0

## 2017-07-19 ENCOUNTER — Telehealth: Payer: 59 | Admitting: Family

## 2017-07-19 DIAGNOSIS — H109 Unspecified conjunctivitis: Secondary | ICD-10-CM

## 2017-07-19 MED ORDER — POLYMYXIN B-TRIMETHOPRIM 10000-0.1 UNIT/ML-% OP SOLN: 2.0000 [drp] | Freq: Four times a day (QID) | OPHTHALMIC | 0 refills | Status: DC

## 2017-07-19 MED FILL — POLYMYXIN B/TMP EYE DROPS: 10000-0.1 | 5 days supply | Qty: 10 | Fill #0

## 2017-07-19 NOTE — Progress Notes (Signed)
Thank you for the details you included in the comment boxes. Those details are very helpful in determining the best course of treatment for you and help us to provide the best care.  We are sorry that you are not feeling well.  Here is how we plan to help!  Based on what you have shared with me it looks like you have conjunctivitis.  Conjunctivitis is a common inflammatory or infectious condition of the eye that is often referred to as "pink eye".  In most cases it is contagious (viral or bacterial). However, not all conjunctivitis requires antibiotics (ex. Allergic).  We have made appropriate suggestions for you based upon your presentation.  I have prescribed Polytrim Ophthalmic drops 2 drops 4 times a day times 5 days  Pink eye can be highly contagious.  It is typically spread through direct contact with secretions, or contaminated objects or surfaces that one may have touched.  Strict handwashing is suggested with soap and water is urged.  If not available, use alcohol based had sanitizer.  Avoid unnecessary touching of the eye.  If you wear contact lenses, you will need to refrain from wearing them until you see no white discharge from the eye for at least 24 hours after being on medication.  You should see symptom improvement in 1-2 days after starting the medication regimen.  Call us if symptoms are not improved in 1-2 days.  Home Care:  Wash your hands often!  Do not wear your contacts until you complete your treatment plan.  Avoid sharing towels, bed linen, personal items with a person who has pink eye.  See attention for anyone in your home with similar symptoms.  Get Help Right Away If:  Your symptoms do not improve.  You develop blurred or loss of vision.  Your symptoms worsen (increased discharge, pain or redness)  Your e-visit answers were reviewed by a board certified advanced clinical practitioner to complete your personal care plan.  Depending on the condition, your plan  could have included both over the counter or prescription medications.  If there is a problem please reply  once you have received a response from your provider.  Your safety is important to us.  If you have drug allergies check your prescription carefully.    You can use MyChart to ask questions about today's visit, request a non-urgent call back, or ask for a work or school excuse for 24 hours related to this e-Visit. If it has been greater than 24 hours you will need to follow up with your provider, or enter a new e-Visit to address those concerns.   You will get an e-mail in the next two days asking about your experience.  I hope that your e-visit has been valuable and will speed your recovery. Thank you for using e-visits.      

## 2017-09-05 MED FILL — RIZATRIPTAN BENZOATE 10 MG: 10 | 30 days supply | Qty: 12 | Fill #0

## 2017-09-06 DIAGNOSIS — Z803 Family history of malignant neoplasm of breast: Secondary | ICD-10-CM | POA: Diagnosis not present

## 2017-09-06 DIAGNOSIS — Z6829 Body mass index (BMI) 29.0-29.9, adult: Secondary | ICD-10-CM | POA: Diagnosis not present

## 2017-09-06 DIAGNOSIS — Z808 Family history of malignant neoplasm of other organs or systems: Secondary | ICD-10-CM | POA: Diagnosis not present

## 2017-09-06 DIAGNOSIS — Z8049 Family history of malignant neoplasm of other genital organs: Secondary | ICD-10-CM | POA: Diagnosis not present

## 2017-09-06 DIAGNOSIS — Z01419 Encounter for gynecological examination (general) (routine) without abnormal findings: Secondary | ICD-10-CM | POA: Diagnosis not present

## 2017-09-10 DIAGNOSIS — H52221 Regular astigmatism, right eye: Secondary | ICD-10-CM | POA: Diagnosis not present

## 2017-10-15 DIAGNOSIS — Z809 Family history of malignant neoplasm, unspecified: Secondary | ICD-10-CM | POA: Diagnosis not present

## 2017-10-18 ENCOUNTER — Telehealth: Payer: 59 | Admitting: Family

## 2017-10-18 DIAGNOSIS — N390 Urinary tract infection, site not specified: Secondary | ICD-10-CM | POA: Diagnosis not present

## 2017-10-18 MED ORDER — CEPHALEXIN 500 MG PO CAPS: 500.0000 mg | ORAL_CAPSULE | Freq: Two times a day (BID) | ORAL | 0 refills | Status: DC

## 2017-10-18 MED FILL — CEPHALEXIN 500 MG CAPSULE: 500 | 7 days supply | Qty: 14 | Fill #0

## 2017-10-18 NOTE — Progress Notes (Signed)

## 2017-10-19 DIAGNOSIS — D2272 Melanocytic nevi of left lower limb, including hip: Secondary | ICD-10-CM | POA: Diagnosis not present

## 2017-10-19 DIAGNOSIS — D2230 Melanocytic nevi of unspecified part of face: Secondary | ICD-10-CM | POA: Diagnosis not present

## 2017-10-19 DIAGNOSIS — Z86018 Personal history of other benign neoplasm: Secondary | ICD-10-CM | POA: Diagnosis not present

## 2017-10-19 DIAGNOSIS — D225 Melanocytic nevi of trunk: Secondary | ICD-10-CM | POA: Diagnosis not present

## 2017-10-19 DIAGNOSIS — D224 Melanocytic nevi of scalp and neck: Secondary | ICD-10-CM | POA: Diagnosis not present

## 2017-10-22 DIAGNOSIS — N3001 Acute cystitis with hematuria: Secondary | ICD-10-CM | POA: Diagnosis not present

## 2018-01-01 DIAGNOSIS — E559 Vitamin D deficiency, unspecified: Secondary | ICD-10-CM | POA: Diagnosis not present

## 2018-01-01 DIAGNOSIS — Z13220 Encounter for screening for lipoid disorders: Secondary | ICD-10-CM | POA: Diagnosis not present

## 2018-01-01 DIAGNOSIS — F4321 Adjustment disorder with depressed mood: Secondary | ICD-10-CM | POA: Diagnosis not present

## 2018-01-01 DIAGNOSIS — F339 Major depressive disorder, recurrent, unspecified: Secondary | ICD-10-CM | POA: Diagnosis not present

## 2018-01-01 DIAGNOSIS — Z0000 Encounter for general adult medical examination without abnormal findings: Secondary | ICD-10-CM | POA: Diagnosis not present

## 2018-01-01 DIAGNOSIS — G43909 Migraine, unspecified, not intractable, without status migrainosus: Secondary | ICD-10-CM | POA: Diagnosis not present

## 2018-03-27 MED FILL — RIZATRIPTAN BENZOATE 10 MG: 10 | 20 days supply | Qty: 12 | Fill #0

## 2018-03-27 MED FILL — buPROPion HCL ER (XL) 150 M: 150 | 30 days supply | Qty: 30 | Fill #0

## 2018-05-06 MED FILL — buPROPion HCL ER (XL) 150 M: 150 | 30 days supply | Qty: 30 | Fill #1

## 2018-05-31 MED FILL — buPROPion HCL ER (XL) 150 M: 150 | 30 days supply | Qty: 30 | Fill #2

## 2018-07-02 MED FILL — buPROPion HCL ER (XL) 150 M: 150 | 90 days supply | Qty: 90 | Fill #0

## 2018-11-26 MED FILL — METAXALONE 800 MG TABS: 800 | 5 days supply | Qty: 10 | Fill #0

## 2018-12-24 ENCOUNTER — Other Ambulatory Visit: Payer: Self-pay | Admitting: Obstetrics and Gynecology

## 2018-12-24 DIAGNOSIS — R928 Other abnormal and inconclusive findings on diagnostic imaging of breast: Secondary | ICD-10-CM

## 2018-12-31 ENCOUNTER — Ambulatory Visit
Admission: RE | Admit: 2018-12-31 | Discharge: 2018-12-31 | Disposition: A | Discharge status: Home / Self Care | Payer: 59 | Source: Ambulatory Visit | Attending: Obstetrics and Gynecology | Admitting: Obstetrics and Gynecology

## 2018-12-31 ENCOUNTER — Ambulatory Visit
Admission: RE | Admit: 2018-12-31 | Discharge: 2018-12-31 | Disposition: A | Discharge status: Home / Self Care | Payer: No Typology Code available for payment source | Source: Ambulatory Visit | Attending: Obstetrics and Gynecology | Admitting: Obstetrics and Gynecology

## 2018-12-31 ENCOUNTER — Other Ambulatory Visit: Payer: Self-pay | Admitting: Obstetrics and Gynecology

## 2018-12-31 ENCOUNTER — Other Ambulatory Visit: Payer: Self-pay

## 2018-12-31 DIAGNOSIS — R928 Other abnormal and inconclusive findings on diagnostic imaging of breast: Secondary | ICD-10-CM

## 2019-01-10 MED FILL — MELOXICAM 7.5 MG TABLET: 7.5 | 30 days supply | Qty: 30 | Fill #0

## 2019-01-10 MED FILL — buPROPion HCL ER (SR) 100 M: 100 | 30 days supply | Qty: 30 | Fill #0

## 2019-01-15 MED FILL — RIZATRIPTAN BENZOATE 10 MG: 10 | 30 days supply | Qty: 12 | Fill #0

## 2019-02-06 ENCOUNTER — Telehealth: Payer: No Typology Code available for payment source | Admitting: Physician Assistant

## 2019-02-06 DIAGNOSIS — B349 Viral infection, unspecified: Secondary | ICD-10-CM

## 2019-02-06 MED ORDER — BENZONATATE 100 MG PO CAPS: 100.0000 mg | ORAL_CAPSULE | Freq: Two times a day (BID) | ORAL | 0 refills | Status: DC | PRN

## 2019-02-06 MED FILL — BENZONATATE 100 MG CAPS: 100 | 10 days supply | Qty: 20 | Fill #0

## 2019-02-06 NOTE — Progress Notes (Signed)
E-Visit for Corona Virus Screening   Your current symptoms could be consistent with the coronavirus.  Many health care providers can now test patients at their office but not all are.  [facility] has multiple testing sites. For information on our COVID testing locations and hours go to [email or web address]  Please quarantine yourself while awaiting your test results.  We are enrolling you in our MyChart Home Montioring for COVID19 . Daily you will receive a questionnaire within the MyChart website. Our COVID 19 response team willl be monitoriing your responses daily. Please continue good preventive care measures, including:  frequent hand-washing, avoid touching your face, cover coughs/sneezes, stay out of crowds and keep a 6 foot distance from others.    COVID-19 is a respiratory illness with symptoms that are similar to the flu. Symptoms are typically mild to moderate, but there have been cases of severe illness and death due to the virus. The following symptoms may appear 2-14 days after exposure: . Fever . Cough . Shortness of breath or difficulty breathing . Chills . Repeated shaking with chills . Muscle pain . Headache . Sore throat . New loss of taste or smell . Fatigue . Congestion or runny nose . Nausea or vomiting . Diarrhea  If you develop fever/cough/breathlessness, please stay home for 10 days with improving symptoms and until you have had 24 hours of no fever (without taking a fever reducer).  Go to the nearest hospital ED for assessment if fever/cough/breathlessness are severe or illness seems like a threat to life.  It is vitally important that if you feel that you have an infection such as this virus or any other virus that you stay home and away from places where you may spread it to others.  You should avoid contact with people age 65 and older.   You should wear a mask or cloth face covering over your nose and mouth if you must be around other  people or animals, including pets (even at home). Try to stay at least 6 feet away from other people. This will protect the people around you.  You can use medication such as A prescription cough medication called Tessalon Perles 100 mg. You may take 1-2 capsules every 8 hours as needed for cough  You may also take acetaminophen (Tylenol) as needed for fever.   Reduce your risk of any infection by using the same precautions used for avoiding the common cold or flu:  . Wash your hands often with soap and warm water for at least 20 seconds.  If soap and water are not readily available, use an alcohol-based hand sanitizer with at least 60% alcohol.  . If coughing or sneezing, cover your mouth and nose by coughing or sneezing into the elbow areas of your shirt or coat, into a tissue or into your sleeve (not your hands). . Avoid shaking hands with others and consider head nods or verbal greetings only. . Avoid touching your eyes, nose, or mouth with unwashed hands.  . Avoid close contact with people who are sick. . Avoid places or events with large numbers of people in one location, like concerts or sporting events. . Carefully consider travel plans you have or are making. . If you are planning any travel outside or inside the [place], visit the CDC's Travelers' Health webpage for the latest health notices. . If you have some symptoms but not all symptoms, continue to monitor at home and seek medical attention if your symptoms worsen. . If   you are having a medical emergency, call 911.  HOME CARE . Only take medications as instructed by your medical team. . Drink plenty of fluids and get plenty of rest. . A steam or ultrasonic humidifier can help if you have congestion.   GET HELP RIGHT AWAY IF YOU HAVE EMERGENCY WARNING SIGNS** FOR COVID-19. If you or someone is showing any of these signs seek emergency medical care immediately. Call 911 or proceed to your closest emergency facility if: . You develop  worsening high fever. . Trouble breathing . Bluish lips or face . Persistent pain or pressure in the chest . New confusion . Inability to wake or stay awake . You cough up blood. . Your symptoms become more severe  **This list is not all possible symptoms. Contact your medical provider for any symptoms that are sever or concerning to you.   MAKE SURE YOU   Understand these instructions.  Will watch your condition.  Will get help right away if you are not doing well or get worse.  Your e-visit answers were reviewed by a board certified advanced clinical practitioner to complete your personal care plan.  Depending on the condition, your plan could have included both over the counter or prescription medications.  If there is a problem please reply once you have received a response from your provider.  Your safety is important to us.  If you have drug allergies check your prescription carefully.    You can use MyChart to ask questions about today's visit, request a non-urgent call back, or ask for a work or school excuse for 24 hours related to this e-Visit. If it has been greater than 24 hours you will need to follow up with your provider, or enter a new e-Visit to address those concerns. You will get an e-mail in the next two days asking about your experience.  I hope that your e-visit has been valuable and will speed your recovery. Thank you for using e-visits.   Greater than 5 minutes, yet less than 10 minutes of time have been spent researching, coordinating, and implementing care for this patient today   

## 2019-04-21 MED FILL — RIZATRIPTAN BENZOATE 10 MG: 10 | 30 days supply | Qty: 12 | Fill #1

## 2019-07-03 ENCOUNTER — Ambulatory Visit
Admission: RE | Admit: 2019-07-03 | Discharge: 2019-07-03 | Disposition: A | Discharge status: Home / Self Care | Payer: No Typology Code available for payment source | Source: Ambulatory Visit | Attending: Obstetrics and Gynecology | Admitting: Obstetrics and Gynecology

## 2019-07-03 ENCOUNTER — Other Ambulatory Visit: Payer: Self-pay

## 2019-07-03 DIAGNOSIS — R928 Other abnormal and inconclusive findings on diagnostic imaging of breast: Secondary | ICD-10-CM

## 2019-07-12 ENCOUNTER — Other Ambulatory Visit (HOSPITAL_COMMUNITY): Payer: Self-pay | Admitting: Family Medicine

## 2019-07-12 MED FILL — RIZATRIPTAN BENZOATE 10 MG: 10 | 30 days supply | Qty: 12 | Fill #0

## 2019-07-12 MED FILL — MELOXICAM 7.5 MG TABLET: 7.5 | 30 days supply | Qty: 30 | Fill #0

## 2019-08-06 MED FILL — valACYclovir HCL 1 GM TABS: 1 | 7 days supply | Qty: 21 | Fill #0

## 2019-08-06 MED FILL — RIZATRIPTAN BENZOATE 10 MG: 10 | 30 days supply | Qty: 12 | Fill #0

## 2019-08-06 MED FILL — MELOXICAM 7.5 MG TABLET: 7.5 | 30 days supply | Qty: 30 | Fill #0

## 2019-11-21 ENCOUNTER — Telehealth: Payer: No Typology Code available for payment source | Admitting: Nurse Practitioner

## 2019-11-21 DIAGNOSIS — J020 Streptococcal pharyngitis: Secondary | ICD-10-CM

## 2019-11-21 MED ORDER — AMOXICILLIN 500 MG PO CAPS: 500.0000 mg | ORAL_CAPSULE | Freq: Two times a day (BID) | ORAL | 0 refills | Status: DC

## 2019-11-21 NOTE — Progress Notes (Signed)

## 2020-02-20 MED FILL — MELOXICAM 7.5 MG TABLET: 7.5 | 30 days supply | Qty: 30 | Fill #1

## 2020-03-02 ENCOUNTER — Other Ambulatory Visit (HOSPITAL_COMMUNITY): Payer: Self-pay | Admitting: Family Medicine

## 2020-03-02 MED FILL — RIZATRIPTAN BENZOATE 10 MG: 10 | 30 days supply | Qty: 12 | Fill #0

## 2020-06-08 ENCOUNTER — Other Ambulatory Visit (HOSPITAL_BASED_OUTPATIENT_CLINIC_OR_DEPARTMENT_OTHER): Payer: Self-pay

## 2020-07-12 ENCOUNTER — Other Ambulatory Visit (HOSPITAL_COMMUNITY): Payer: Self-pay

## 2020-07-12 MED ORDER — MELOXICAM 7.5 MG PO TABS
7.5000 mg | ORAL_TABLET | Freq: Every day | ORAL | 1 refills | Status: DC | PRN
  Filled 2020-07-12: qty 90, 90d supply, fill #0
  Filled 2021-02-18: qty 90, 90d supply, fill #1

## 2020-07-12 MED ORDER — RIZATRIPTAN BENZOATE 10 MG PO TABS
ORAL_TABLET | ORAL | 6 refills | Status: DC
  Filled 2020-07-12: qty 12, 30d supply, fill #0
  Filled 2020-11-08: qty 12, 30d supply, fill #1
  Filled 2021-02-18: qty 12, 30d supply, fill #2

## 2020-07-13 ENCOUNTER — Other Ambulatory Visit (HOSPITAL_COMMUNITY): Payer: Self-pay

## 2020-07-14 ENCOUNTER — Other Ambulatory Visit (HOSPITAL_COMMUNITY): Payer: Self-pay

## 2020-07-23 ENCOUNTER — Other Ambulatory Visit: Payer: Self-pay | Admitting: Obstetrics and Gynecology

## 2020-07-23 DIAGNOSIS — Z1231 Encounter for screening mammogram for malignant neoplasm of breast: Secondary | ICD-10-CM

## 2020-09-23 ENCOUNTER — Ambulatory Visit
Admission: RE | Admit: 2020-09-23 | Discharge: 2020-09-23 | Disposition: A | Discharge status: Home / Self Care | Payer: No Typology Code available for payment source | Source: Ambulatory Visit | Attending: Obstetrics and Gynecology | Admitting: Obstetrics and Gynecology

## 2020-09-23 ENCOUNTER — Other Ambulatory Visit: Payer: Self-pay

## 2020-09-23 DIAGNOSIS — Z1231 Encounter for screening mammogram for malignant neoplasm of breast: Secondary | ICD-10-CM

## 2020-11-08 ENCOUNTER — Other Ambulatory Visit (HOSPITAL_COMMUNITY): Payer: Self-pay

## 2021-02-18 ENCOUNTER — Other Ambulatory Visit (HOSPITAL_COMMUNITY): Payer: Self-pay

## 2021-02-21 ENCOUNTER — Other Ambulatory Visit (HOSPITAL_COMMUNITY): Payer: Self-pay

## 2021-03-11 ENCOUNTER — Other Ambulatory Visit (HOSPITAL_COMMUNITY): Payer: Self-pay

## 2021-03-11 MED ORDER — LEVONORGEST-ETH ESTRAD 91-DAY 0.1-0.02 & 0.01 MG PO TABS
ORAL_TABLET | ORAL | 3 refills | Status: DC
  Filled 2021-03-11: qty 91, 91d supply, fill #0

## 2021-03-15 ENCOUNTER — Other Ambulatory Visit (HOSPITAL_COMMUNITY): Payer: Self-pay

## 2021-04-28 ENCOUNTER — Other Ambulatory Visit (HOSPITAL_COMMUNITY): Payer: Self-pay

## 2021-04-28 MED ORDER — CARESTART COVID-19 HOME TEST VI KIT
PACK | 0 refills | Status: DC
  Filled 2021-04-28: qty 4, 4d supply, fill #0

## 2021-05-02 ENCOUNTER — Other Ambulatory Visit (HOSPITAL_COMMUNITY): Payer: Self-pay

## 2021-05-02 MED ORDER — RIZATRIPTAN BENZOATE 10 MG PO TABS
ORAL_TABLET | ORAL | 6 refills | Status: DC
  Filled 2021-05-02: qty 12, 30d supply, fill #0

## 2021-05-05 ENCOUNTER — Other Ambulatory Visit: Payer: Self-pay | Admitting: Family Medicine

## 2021-05-05 ENCOUNTER — Ambulatory Visit
Admission: RE | Admit: 2021-05-05 | Discharge: 2021-05-05 | Disposition: A | Discharge status: Home / Self Care | Payer: No Typology Code available for payment source | Source: Ambulatory Visit | Attending: Family Medicine | Admitting: Family Medicine

## 2021-05-05 DIAGNOSIS — M25511 Pain in right shoulder: Secondary | ICD-10-CM

## 2021-05-05 DIAGNOSIS — M25512 Pain in left shoulder: Secondary | ICD-10-CM

## 2021-05-10 ENCOUNTER — Other Ambulatory Visit (HOSPITAL_COMMUNITY): Payer: Self-pay

## 2021-05-11 ENCOUNTER — Other Ambulatory Visit (HOSPITAL_COMMUNITY): Payer: Self-pay

## 2021-05-11 MED ORDER — WEGOVY 0.25 MG/0.5ML ~~LOC~~ SOAJ
SUBCUTANEOUS | 2 refills | Status: DC
  Filled 2021-05-11: qty 4, 56d supply, fill #0

## 2021-05-12 ENCOUNTER — Other Ambulatory Visit (HOSPITAL_COMMUNITY): Payer: Self-pay

## 2021-05-25 ENCOUNTER — Encounter: Payer: Self-pay | Admitting: *Deleted

## 2021-05-25 ENCOUNTER — Other Ambulatory Visit: Payer: Self-pay | Admitting: *Deleted

## 2021-05-30 ENCOUNTER — Ambulatory Visit: Payer: No Typology Code available for payment source | Admitting: Psychiatry

## 2021-05-30 ENCOUNTER — Encounter: Payer: Self-pay | Admitting: Psychiatry

## 2021-05-30 VITALS — BP 114/69 | HR 84 | Ht 64.0 in | Wt 176.0 lb

## 2021-05-30 DIAGNOSIS — G43909 Migraine, unspecified, not intractable, without status migrainosus: Secondary | ICD-10-CM | POA: Insufficient documentation

## 2021-05-30 DIAGNOSIS — G43009 Migraine without aura, not intractable, without status migrainosus: Secondary | ICD-10-CM

## 2021-05-30 NOTE — Patient Instructions (Addendum)
Continue to take Maxalt at the onset of migraine. If headache recurs or does not fully resolve, you may take a second dose after 2 hours. Please avoid taking more than 2 days per week to avoid rebound headaches  Try progesterone only birth control as this is less likely to affect your migraines   GENERAL HEADACHE INFORMATION Headache Preventive Treatment: Please keep in mind that it takes 4-6 weeks for the medication to start working well and 2-3 months at the appropriate dose before deciding if it will be useful or not. If it is not helping at all by this time, then we will discuss other medications to try. Supplements may take 3-6 months until you see full effect.   Natural supplements: Magnesium Oxide or Magnesium Glycinate 500 mg at bed (up to 800 mg daily) Coenzyme Q10 300 mg in AM Vitamin B2- 200 mg twice a day  Add 1 supplement at a time since even natural supplements can have undesirable side effects. You can sometimes buy supplements cheaper (especially Coenzyme Q10) at www.https://compton-perez.com/ or at [employer].  Vitamins and herbs that show potential:  Magnesium: Magnesium (250 mg twice a day or 500 mg at bed) has a relaxant effect on smooth muscles such as blood vessels. Individuals suffering from frequent or daily headache usually have low magnesium levels which can be increase with daily supplementation of 400-750 mg. Three trials found 40-90% average headache reduction  when used as a preventative. Magnesium also demonstrated the benefit in menstrually related migraine.  Magnesium is part of the messenger system in the serotonin cascade and it is a good muscle relaxant.  It is also useful for constipation which can be a side effect of other medications used to treat migraine. Good sources include nuts, whole grains, and tomatoes. Side Effects: loose stool/diarrhea Riboflavin (vitamin B 2) 200 mg twice a day. This vitamin assists nerve cells in the production of ATP a principal energy storing  molecule.  It is necessary for many chemical reactions in the body.  There have been at least 3 clinical trials of riboflavin using 400 mg per day all of which suggested that migraine frequency can be decreased.  All 3 trials showed significant improvement in over half of migraine sufferers.  The supplement is found in bread, cereal, milk, meat, and poultry.  Most Americans get more riboflavin than the recommended daily allowance, however riboflavin deficiency is not necessary for the supplements to help prevent headache. Side effects: energizing, green urine  Coenzyme Q10: This is present in almost all cells in the body and is critical component for the conversion of energy.  Recent studies have shown that a nutritional supplement of CoQ10 can reduce the frequency of migraine attacks by improving the energy production of cells as with riboflavin.  Doses of 150 mg twice a day have been shown to be effective.  Melatonin: Increasing evidence shows correlation between melatonin secretion and headache conditions.  Melatonin supplementation has decreased headache intensity and duration.  It is widely used as a sleep aid.  Sleep is natures way of dealing with migraine.  A dose of 3 mg is recommended to start for headaches including cluster headache. Higher doses up to 15 mg has been reviewed for use in Cluster headache and have been used. The rationale behind using melatonin for cluster is that many theories regarding the cause of Cluster headache center around the disruption of the normal circadian rhythm in the brain.  This helps restore the normal circadian rhythm.  HEADACHE  DIET: Foods and beverages which may trigger migraine Note that only 20% of headache patients are food sensitive. You will know if you are food sensitive if you get a headache consistently 20 minutes to 2 hours after eating a certain food. Only cut out a food if it causes headaches, otherwise you might remove foods you enjoy! What matters  most for diet is to eat a well balanced healthy diet full of vegetables and low fat protein, and to not miss meals.  Chocolate, other sweets ALL cheeses except cottage and cream cheese Dairy products, yogurt, sour cream, ice cream Liver Meat extracts (Bovril, Marmite, meat tenderizers) Meats or fish which have undergone aging, fermenting, pickling or smoking. These include: Hotdogs,salami,Lox,sausage, mortadellas,smoked salmon, pepperoni, Pickled herring Pods of broad bean (English beans, Chinese pea pods, [place] (fava) beans, lima and navy beans Ripe avocado, ripe banana Yeast extracts or active yeast preparations such as Brewer's or Fleishman's (commercial bakes goods are permitted) Tomato based foods, pizza (lasagna, etc.)  MSG (monosodium glutamate) is disguised as many things; look for these common aliases: Monopotassium glutamate Autolysed yeast Hydrolysed protein Sodium caseinate flavorings all natural preservatives" Nutrasweet  Avoid all other foods that convincingly provoke headaches.  Resources: The Dizzy Lu Duffel Your Headache Diet, migrainestrong.com  https://www.aguirre.org/  Caffeine and Migraine For patients that have migraine, caffeine intake more than 3 days per week can lead to dependency and increased migraine frequency. I would recommend cutting back on your caffeine intake as best you can. The recommended amount of caffeine is 200-300 mg daily, although migraine patients may experience dependency at even lower doses. While you may notice an increase in headache temporarily, cutting back will be helpful for headaches in the long run. For more information on caffeine and migraine, visit: https://americanmigrainefoundation.org/resource-library/caffeine-and-migraine/  Headache Prevention Strategies:  1. Maintain a headache diary; learn to identify and avoid triggers.  - This can be a simple note where you log  when you had a headache, associated symptoms, and medications used - There are several smartphone apps developed to help track migraines: Migraine Buddy, Migraine Monitor, Curelator N1-Headache App  Common triggers include: Emotional triggers: Emotional/Upset family or friends Emotional/Upset occupation Business reversal/success Anticipation anxiety Crisis-serious Post-crisis periodNew job/position   Physical triggers: Vacation Day Weekend Strenuous Exercise High Altitude Location New Move Menstrual Day Physical Illness Oversleep/Not enough sleep Weather changes Light: Photophobia or light sesnitivity treatment involves a balance between desensitization and reduction in overly strong input. Use dark polarized glasses outside, but not inside. Avoid bright or fluorescent light, but do not dim environment to the point that going into a normally lit room hurts. Consider FL-41 tint lenses, which reduce the most irritating wavelengths without blocking too much light.  These can be obtained at axonoptics.com or theraspecs.com Foods: see list above.  2. Limit use of acute treatments (over-the-counter medications, triptans, etc.) to no more than 2 days per week or 10 days per month to prevent medication overuse headache (rebound headache).    3. Follow a regular schedule (including weekends and holidays): Don't skip meals. Eat a balanced diet. 8 hours of sleep nightly. Minimize stress. Exercise 30 minutes per day. Being overweight is associated with a 5 times increased risk of chronic migraine. Keep well hydrated and drink 6-8 glasses of water per day.  4. Initiate non-pharmacologic measures at the earliest onset of your headache. Rest and quiet environment. Relax and reduce stress. Breathe2Relax is a free app that can instruct you on    some simple relaxtion and  breathing techniques. Http://Dawnbuse.com is a    free website that provides teaching videos on relaxation.  Also, there are   many apps that   can be downloaded for mindful relaxation.  An app called YOGA NIDRA will help walk you through mindfulness. Another app called Calm can be downloaded to give you a structured mindfulness guide with daily reminders and skill development. Headspace for guided meditation Mindfulness Based Stress Reduction Online Course: www.palousemindfulness.com Cold compresses.  5. Don't wait!! Take the maximum allowable dosage of prescribed medication at the first sign of migraine.  6. Compliance:  Take prescribed medication regularly as directed and at the first sign of a migraine.  7. Communicate:  Call your physician when problems arise, especially if your headaches change, increase in frequency/severity, or become associated with neurological symptoms (weakness, numbness, slurred speech, etc.).  8. Headache/pain management therapies: Consider various complementary methods, including medication, behavioral therapy, psychological counselling, biofeedback, massage therapy, acupuncture, dry needling, and other modalities.  Such measures may reduce the need for medications. Counseling for pain management, where patients learn to function and ignore/minimize their pain, seems to work very well.  9. Recommend changing family's attention and focus away from patient's headaches. Instead, emphasize daily activities. If first question of day is 'How are your headaches/Do you have a headache today?', then patient will constantly think about headaches, thus making them worse. Goal is to re-direct attention away from headaches, toward daily activities and other distractions.  10. Helpful Websites: www.AmericanHeadacheSociety.org VoipObserver.it www.headaches.org GolfingFamily.no www.achenet.org

## 2021-05-30 NOTE — Progress Notes (Signed)
? ?Referring:  ?Vernie Shanks, MD ?[address] ?[place],  [place] [address] ? ?PCP: ?Vernie Shanks, MD ? ?Neurology was asked to evaluate [facility], a 41 year old female for a chief complaint of headaches.  Our recommendations of care will be communicated by shared medical record.   ? ?CC:  headaches ? ?HPI:  ?Medical co-morbidities: depression, vitamin D deficiency ? ?The patient presents for evaluation of headaches which began when she was 41 years old. They started to worsen at the end of 2022. Seemed especially worse around her cycle so she was started on a new OCP, but this worsened her migraines.  Prior to starting OCPs she would have 1-4 migraines per month. After starting OCPs she started to have migraines every day. She has since stopped her birth control which did improve her migraines. She has only had one migraine since stopping it. Headaches are described as unilateral occipital throbbing with associated photophobia, phonophobia, and nausea. They can last from several hours to 3 days. ? ?Takes Maxalt and Mobic as needed which does typically resolve her migraine. ? ?Headache History: ?Onset: 41 years old ?Triggers: menstrual cycle, weather changes, fasting, alcohol, neck pain ?Aura: no ?Location: unilateral (either side), occipital radiating forward ?Quality/Description: throbbing ?Associated Symptoms: ? Photophobia: yes ? Phonophobia: yes ? Nausea: yes ?Vomiting: only twice ?Worse with activity?: yes ?Duration of headaches: up to 3 days ? ?Headache days per month: 41 year old female ?Headache free days per month: 26 ? ?Current Treatment: ?Abortive ?Maxalt 10 mg PRN ?Mobic 7.5 mg PRN ? ?Preventative ?Butterbur ? ?Prior Therapies                                 ?Maxalt 10 mg PRN ?Fioricet ?Mobic ?Relpax - chest tightness ?Magnesium - hypermagnesemia ? ?LABS: ?05/03/21: CMP, CBC wnl ? ?IMAGING:  ?C-spine X-ray 05/05/21: unremarkable ? ?Imaging independently reviewed on May 30, 2021  ? ?Current Outpatient  Medications on File Prior to Visit  ?Medication Sig Dispense Refill  ? butalbital-acetaminophen-caffeine (FIORICET, ESGIC) 50-325-40 MG per tablet Take 2 tablets by mouth 2 (two) times daily as needed. Take two tablets at onset of migraine, then take one tablet every 6 hours as needed for migraine pain    ? meloxicam (MOBIC) 7.5 MG tablet Take 1 tablet (7.5 mg total) by mouth daily as needed for migraine 90 tablet 1  ? NON FORMULARY     ? Semaglutide-Weight Management (WEGOVY) 0.25 MG/0.5ML SOAJ Inject 0.25 mg into the skin once every week 4 mL 2  ? rizatriptan (MAXALT) 10 MG tablet TAKE 1 TABLET BY MOUTH FOR MIGRAINES AS NEEDED 12 tablet 0  ? ?No current facility-administered medications on file prior to visit.  ? ? ? ?Allergies: ?No Known Allergies ? ?Family History: ?Migraine or other headaches in the family:  grandmother ?Aneurysms in a first degree relative:  no ?Brain tumors in the family:  no ?Other neurological illness in the family:   grandmother had strokes ? ?Past Medical History: ?Past Medical History:  ?Diagnosis Date  ? Anemia   ? hx  ? Depression   ? H/O varicella   ? Headache(784.0)   ? migraines  ? History of shingles   ? x2  ? Migraine   ? ? ?Past Surgical History ?Past Surgical History:  ?Procedure Laterality Date  ? MOLE REMOVAL    ? WISDOM TOOTH EXTRACTION    ? ? ?Social History: ?Social History  ? ?  Tobacco Use  ? Smoking status: Never  ? Smokeless tobacco: Never  ?Substance Use Topics  ? Alcohol use: No  ? Drug use: No  ? ? ?ROS: ?Negative for fevers, chills. Positive for headaches. All other systems reviewed and negative unless stated otherwise in HPI. ? ? ?Physical Exam:  ? ?Vital Signs: ?BP 114/69   Pulse 84   Ht '5\' 4"'$  (1.626 m)   Wt 176 lb (79.8 kg)   BMI 30.21 kg/m?  ?GENERAL: well appearing,in no acute distress,alert ?SKIN:  Color, texture, turgor normal. No rashes or lesions ?HEAD:  Normocephalic/atraumatic. ?CV:  RRR ?RESP: Normal respiratory effort ?MSK: no tenderness to palpation  over occiput, neck, or shoulders ? ?NEUROLOGICAL: ?Mental Status: Alert, oriented to person, place and time,Follows commands ?Cranial Nerves: PERRL, visual fields intact to confrontation, extraocular movements intact, facial sensation intact, no facial droop or ptosis, hearing grossly intact, no dysarthria ?Motor: muscle strength 5/5 both upper and lower extremities,no drift, normal tone ?Reflexes: 2+ throughout ?Sensation: intact to light touch all 4 extremities ?Coordination: Finger-to- nose-finger intact bilaterally ?Gait: normal-based ? ? ?IMPRESSION: ?41 year old female with a history of depression, vitamin D deficiency who presents for evaluation of migraines. Her current headache pattern is consistent with episodic migraine without aura. Maxalt and Mobic currently work well for rescue. Encouraged her to try a second Maxalt as needed if headache does not resolve in 2 hours. Discussed supplement options including B2 and CoQ10. She previously had some improvement with neck PT and may be interested in trying this again in the future, but would like to hold off for now as headaches seem to be improving. ? ?PLAN: ?-Rescue: Continue Maxalt 10 mg PRN, Mobic 7.5 mg PRN ?-Discussed supplement options for migraine prevention (B2, CoQ10) ?-next steps: consider neck PT ? ? ?I spent a total of 32 minutes chart reviewing and counseling the patient. Headache education was done. Discussed treatment options including preventive and acute medications, natural supplements, and physical therapy. Discussed medication overuse headache and to limit use of acute treatments to no more than 2 days/week or 10 days/month. Discussed medication side effects, adverse reactions and drug interactions. Written educational materials and patient instructions outlining all of the above were given. ? ?Follow-up: 4 months ? ? ?Genia Harold, MD ?05/30/2021   ?9:52 AM ? ? ?

## 2021-06-09 ENCOUNTER — Other Ambulatory Visit (HOSPITAL_COMMUNITY): Payer: Self-pay

## 2021-06-09 MED ORDER — WEGOVY 0.5 MG/0.5ML ~~LOC~~ SOAJ
SUBCUTANEOUS | 1 refills | Status: DC
  Filled 2021-06-09: qty 2, 28d supply, fill #0

## 2021-07-11 ENCOUNTER — Other Ambulatory Visit (HOSPITAL_COMMUNITY): Payer: Self-pay

## 2021-07-11 MED ORDER — WEGOVY 1 MG/0.5ML ~~LOC~~ SOAJ
SUBCUTANEOUS | 1 refills | Status: DC
  Filled 2021-07-11: qty 2, 28d supply, fill #0
  Filled 2021-08-09: qty 2, 28d supply, fill #1
  Filled 2021-08-17: qty 2, 28d supply, fill #0
  Filled 2021-08-17: qty 2, 28d supply, fill #1

## 2021-07-12 ENCOUNTER — Other Ambulatory Visit (HOSPITAL_COMMUNITY): Payer: Self-pay

## 2021-08-03 ENCOUNTER — Ambulatory Visit: Payer: Self-pay

## 2021-08-03 ENCOUNTER — Other Ambulatory Visit (HOSPITAL_COMMUNITY): Payer: Self-pay

## 2021-08-03 ENCOUNTER — Encounter: Payer: Self-pay | Admitting: Sports Medicine

## 2021-08-03 ENCOUNTER — Ambulatory Visit: Payer: No Typology Code available for payment source | Admitting: Sports Medicine

## 2021-08-03 VITALS — BP 112/78 | Ht 64.0 in | Wt 158.0 lb

## 2021-08-03 DIAGNOSIS — M629 Disorder of muscle, unspecified: Secondary | ICD-10-CM

## 2021-08-03 DIAGNOSIS — M25551 Pain in right hip: Secondary | ICD-10-CM

## 2021-08-03 DIAGNOSIS — M7061 Trochanteric bursitis, right hip: Secondary | ICD-10-CM

## 2021-08-03 MED ORDER — MELOXICAM 7.5 MG PO TABS
7.5000 mg | ORAL_TABLET | Freq: Every day | ORAL | 1 refills | Status: AC | PRN
  Filled 2021-08-03 – 2021-08-12 (×2): qty 90, 90d supply, fill #0
  Filled 2021-11-28: qty 90, 90d supply, fill #1

## 2021-08-03 NOTE — Progress Notes (Signed)
PCP: Morgan Shanks, MD ? ?Subjective:  ? ?HPI: ?Morgan Garrett is a pleasant 41 y.o. female here for evaluation of right lateral hip pain and right neck/shoulder pain. ? ?Right lateral hip pain -a few months ago patient was performing some exercise in a Pilates class and following this she had some onset of right lateral hip pain.  She felt like when she would turn directions weekly she would feel a catching and pain over the lateral aspect of the hip.  She also noticed during Pilates when they were doing hip abduction exercises that her right hip could not lift very far up compared to the other individuals and her contralateral hip.  She does take Aleve as needed although here over the last few weeks she feels like the hip pain has been much improved.  She denies any redness or swelling. ? ?Right neck/trapezius pain -this has been a chronic issue for Morgan Garrett.  Her pain comes and goes for the last few years.  She feels a tightness and aching pain deep within the trapezius muscle on the right.  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  She is right-hand dominant and uses her right hand to work the mouse.  She denies any radicular symptoms going down the arm.  No numbness tingling or weakness of the upper extremity. ? ?Past Medical History:  ?Diagnosis Date  ? Anemia   ? hx  ? Depression   ? H/O varicella   ? Headache(784.0)   ? migraines  ? History of shingles   ? x2  ? Migraine   ? ? ?Current Outpatient Medications on File Prior to Visit  ?Medication Sig Dispense Refill  ? butalbital-acetaminophen-caffeine (FIORICET, ESGIC) 50-325-40 MG per tablet Take 2 tablets by mouth 2 (two) times daily as needed. Take two tablets at onset of migraine, then take one tablet every 6 hours as needed for migraine pain    ? NON FORMULARY     ? rizatriptan (MAXALT) 10 MG tablet TAKE 1 TABLET BY MOUTH FOR MIGRAINES AS NEEDED 12 tablet 0  ? Semaglutide-Weight Management (WEGOVY) 0.25 MG/0.5ML SOAJ Inject 0.25 mg into the  skin once every week 4 mL 2  ? Semaglutide-Weight Management (WEGOVY) 0.5 MG/0.5ML SOAJ Inject 0.5 mg under the tongue once weekly 2 mL 1  ? Semaglutide-Weight Management (WEGOVY) 1 MG/0.5ML SOAJ Inject 1 mg into the skin every week 2 mL 1  ? ?No current facility-administered medications on file prior to visit.  ? ? ?Past Surgical History:  ?Procedure Laterality Date  ? MOLE REMOVAL    ? WISDOM TOOTH EXTRACTION    ? ? ?No Known Allergies ? ?BP 112/78   Ht '5\' 4"'$  (1.626 m)   Wt 158 lb (71.7 kg)   BMI 27.12 kg/m?  ? ? ?  08/03/2021  ?  9:59 AM  ?[facility]  ?Frequency of aerobic exercise (# of days/week) 4  ?Average time in minutes 50  ?Frequency of strengthening activities (# of days/week) 4  ? ? ?   ? View : No data to display.  ?  ?  ?  ? ? ?    ?Objective:  ?Physical Exam: ? ?Gen: Well-appearing, in no acute distress; non-toxic ?CV: Regular Rate. Well-perfused. Warm.  ?Resp: Breathing unlabored on room air; no wheezing. ?Psych: Fluid speech in conversation; appropriate affect; normal thought process ?Neuro: Sensation intact throughout. No gross coordination deficits.  ?MSK:  ?- Cervical/shoulder: Examination of the neck  and shoulders demonstrates no gross deformity, swelling or ecchymosis.  There is no TTP over the cervical spinous process.  There is some mild hypertonicity of the right sided cervical paraspinal muscles, significant hypertonicity and some pain with palpation of the right trapezius belly.  Does appear to have a mildly elevated first rib on the right.  Full active range of motion of the cervical spine in all directions.  5/5 strength and full range of motion of bilateral upper extremities.  Neurovascular intact distally.  Negative Spurling's test.  She does have forward head nutation as well as bilateral shoulder protraction. ? ?- Right hip: There is some TTP over the posterior lateral aspect of the greater trochanter.  Inspection demonstrates no rash, erythema or  ecchymosis.  Passive logroll equivalent bilaterally without restriction.  She does have pain with FABER testing of the hip over the greater trochanter.  There is notable weakness with hip abduction on the right compared to the left hip.  Otherwise full range of motion and 5/5 strength about the hip in all directions.  No SI joint TTP. ? ?MSK Limited hip ultrasound performed, right ?  ?- Evaluation of the greater tuberosity was evaluated in both short and long axis.   ?- There is no bony arthritic change noted over the tubercle. ?-The gluteus medius and minimus tendons were identified in both short and long axis without evidence of gross tearing or tendinopathic changes ?-There is a very faint layer of hypoechoic fluid in between the gluteus medius and minimus tendons as it courses over the greater trochanter. ?  ?IMPRESSION: Mild greater trochanteric bursitis with hypoechoic fluid between glute medius and glute minimus tendons without evidence of tendinopathy. ? ?  ?Assessment & Plan:  ?1. Greater trochanteric bursitis, right hip ?2.  Right trapezius hypertonicity ?3. Abnormal posture - bilateral shoulder protraction and head nutation ?4.  Right scapular dyskinesia ? ?I had a discussion with [name] today regarding the etiology of her pain and her management options.  Her ultrasound suggest a very mild layer of fluid over the greater trochanteric bursa.  This has gotten better with conservative care but she does still have quite significant weakness compared to the contralateral hip.  We provided her a hip abduction series to work on strengthening of the hip abductors and external rotators.  She will take meloxicam 7.5 mg, 2 tablets once daily with food for the next 10 days, then as needed from there.  In terms of the neck, this seems biomechanical in nature as she does have abnormal posture likely from her prolonged sitting at her job.  We did give her home exercises to work on neck isometrics, proper posture, and  scapular retraction.  I also advised to continue soft tissue massage.  We can always consider trigger point injection into the trapezius muscle at a later date if this still gives her an issue.  We will see her back in about 4-5 weeks after performing home exercises and medication see how she is doing. ? ?Elba Barman, DO ?PGY-4, Sports Medicine Fellow ?[place] ? ?This note was dictated using Dragon naturally speaking software and may contain errors in syntax, spelling, or content which have not been identified prior to signing this note.  ? ?Addendum:  I was the preceptor for this visit and available for immediate consultation.  Karlton Lemon MD CAQSM ? ? ?

## 2021-08-03 NOTE — Progress Notes (Signed)
Patient was instructed in 10 minutes of therapeutic exercises for right hip pain/neck pain to improve strength, ROM and function according to my instructions and plan of care by a Certified Athletic Trainer during the office visit.  Proper technique shown and discussed, handout provided.  All questions discussed and answered.  ?

## 2021-08-03 NOTE — Patient Instructions (Signed)
It was great to meet you today, thank you for letting me participate in your care! ? ?Today, we discussed your right hip and right neck/trapezius. ? ?For the right hip -we saw some evidence of very mild fluid within the bursa.  This is from weakness of your gluteus medius and minimus tendons which help with hip abduction and external rotation.  Your neck and shoulder is more from trapezius muscle tightness and hypertonicity.  This is likely from postural changes and how to use it at work. ? ?Things we will do: ?-Take meloxicam 7.5 mg, 2 tablets once daily with food for the next 10-14 days, then as needed from there ?-Continue heat and massage for the neck ?-Perform exercises for the hip and postural exercises for the neck once daily.  You should start noticing a difference after a few weeks to a month or 2. ? ?You will follow-up with me in about 4-5 weeks for reevaluation. ? ?If you have any further questions, please give the clinic a call [phone]. ? ?Cheers, ? ?Elba Barman, DO ?[place] ? ?

## 2021-08-09 ENCOUNTER — Other Ambulatory Visit (HOSPITAL_COMMUNITY): Payer: Self-pay

## 2021-08-09 MED ORDER — SCOPOLAMINE 1 MG/3DAYS TD PT72
MEDICATED_PATCH | TRANSDERMAL | 0 refills | Status: DC
  Filled 2021-08-09: qty 4, 12d supply, fill #0

## 2021-08-11 ENCOUNTER — Other Ambulatory Visit (HOSPITAL_COMMUNITY): Payer: Self-pay

## 2021-08-12 ENCOUNTER — Other Ambulatory Visit (HOSPITAL_COMMUNITY): Payer: Self-pay

## 2021-08-17 ENCOUNTER — Other Ambulatory Visit (HOSPITAL_COMMUNITY): Payer: Self-pay

## 2021-08-22 ENCOUNTER — Other Ambulatory Visit (HOSPITAL_COMMUNITY): Payer: Self-pay

## 2021-08-22 MED ORDER — RIZATRIPTAN BENZOATE 10 MG PO TABS
ORAL_TABLET | ORAL | 6 refills | Status: AC
  Filled 2021-08-22: qty 12, 30d supply, fill #0
  Filled 2021-11-28: qty 12, 30d supply, fill #1
  Filled 2021-12-30: qty 12, 30d supply, fill #2
  Filled 2022-04-12: qty 12, 30d supply, fill #3
  Filled 2022-05-08: qty 12, 30d supply, fill #4
  Filled 2022-07-24 – 2022-07-25 (×2): qty 12, 30d supply, fill #5

## 2021-08-23 ENCOUNTER — Other Ambulatory Visit (HOSPITAL_COMMUNITY): Payer: Self-pay

## 2021-08-23 ENCOUNTER — Telehealth: Payer: No Typology Code available for payment source | Admitting: Physician Assistant

## 2021-08-23 ENCOUNTER — Telehealth: Payer: No Typology Code available for payment source

## 2021-08-23 DIAGNOSIS — Z20818 Contact with and (suspected) exposure to other bacterial communicable diseases: Secondary | ICD-10-CM | POA: Diagnosis not present

## 2021-08-23 DIAGNOSIS — J039 Acute tonsillitis, unspecified: Secondary | ICD-10-CM

## 2021-08-23 MED ORDER — AMOXICILLIN 500 MG PO CAPS
500.0000 mg | ORAL_CAPSULE | Freq: Two times a day (BID) | ORAL | 0 refills | Status: AC
  Filled 2021-08-23: qty 20, 10d supply, fill #0

## 2021-08-23 NOTE — Progress Notes (Signed)
I have spent 5 minutes in review of e-visit questionnaire, review and updating patient chart, medical decision making and response to patient.   Cason Dabney Cody Jarrick Fjeld, PA-C    

## 2021-08-23 NOTE — Progress Notes (Signed)

## 2021-09-07 ENCOUNTER — Ambulatory Visit (INDEPENDENT_AMBULATORY_CARE_PROVIDER_SITE_OTHER): Payer: No Typology Code available for payment source | Admitting: Sports Medicine

## 2021-09-07 VITALS — BP 108/44 | Ht 64.0 in | Wt 165.0 lb

## 2021-09-07 DIAGNOSIS — M629 Disorder of muscle, unspecified: Secondary | ICD-10-CM | POA: Diagnosis not present

## 2021-09-07 DIAGNOSIS — M7061 Trochanteric bursitis, right hip: Secondary | ICD-10-CM

## 2021-09-07 NOTE — Progress Notes (Signed)
PCP: Vernie Shanks, MD  Subjective:   HPI: Morgan Garrett is a pleasant 41 y.o. female here for follow-up of lateral hip pain and neck/trap pain.  She states that both the hip and the neck/trap area have completely improved.  Over the last week and a half she has not had any pain.  She did take Mobic for the first 2 days, but felt like this made her somewhat sleepy so she discontinued this.  She had recently returned from a cruise and states this certainly helped relax her neck and trapezius.  She had been consistent initially with the hip strengthening exercises and the neck exercises, but has backed off on these since her pain has improved.  She denies any new injury.   BP (!) 108/44   Ht '5\' 4"'$  (1.626 m)   Wt 165 lb (74.8 kg)   BMI 28.32 kg/m      08/03/2021    9:59 AM  [facility]  Frequency of aerobic exercise (# of days/week) 4  Average time in minutes 50  Frequency of strengthening activities (# of days/week) 4        No data to display              Objective:  Physical Exam:  Gen: Well-appearing, in no acute distress; non-toxic CV: Regular Rate. Well-perfused. Warm.  Resp: Breathing unlabored on room air; no wheezing. Psych: Fluid speech in conversation; appropriate affect; normal thought process Neuro: Sensation intact throughout. No gross coordination deficits.  MSK:   - Neck/Trapezius: Examination of the head and neck does show improvement in anatomic alignment.  There is no TTP or hypertonicity of the cervical paraspinals or trapezius muscle.  She has full range of motion of the neck in all directions.  - Right hip: There is no TTP over the greater trochanter or anterior hip.  Passive logroll equivalent bilaterally without restriction.  There is markedly improved and now equivocal strength of the hip with abduction.  She now has full strength and range of motion in all directions.  Negative FABER and FADIR testing.  Neurovascular intact  distally.   Assessment & Plan:  1. Greater trochanteric bursitis - improved 2. Abnormal posture with right trapezius hypertonicity - improved  We both are very pleased that she has had complete resolution of her pain.  We did discuss continuing proper ergonomics, she has a standing desk at work and so we discussed proper alignment and ergonomics to prevent her pain from returning.  In terms of the hip, she now has equivocal hip abduction strength.  She can back off of these exercises and work on them once-twice weekly for prevention and maintenance at this point.  She can discontinue the meloxicam given that this is likely not inflammatory anymore.  We did discuss wearing supportive shoe wear or transitioning from her slippers to [place] recovery slides for more foot support when she is working on hard floor/hardwood.  We will follow-up on an as-needed basis.  Elba Barman, DO PGY-4, Sports Medicine Fellow [place]  This note was dictated using Dragon naturally speaking software and may contain errors in syntax, spelling, or content which have not been identified prior to signing this note.   Addendum:  I was the preceptor for this visit and available for immediate consultation.  Karlton Lemon MD [facility] Boys

## 2021-09-08 ENCOUNTER — Other Ambulatory Visit: Payer: Self-pay | Admitting: Obstetrics and Gynecology

## 2021-09-08 DIAGNOSIS — Z1231 Encounter for screening mammogram for malignant neoplasm of breast: Secondary | ICD-10-CM

## 2021-09-26 ENCOUNTER — Ambulatory Visit
Admission: RE | Admit: 2021-09-26 | Discharge: 2021-09-26 | Disposition: A | Discharge status: Home / Self Care | Payer: No Typology Code available for payment source | Source: Ambulatory Visit | Attending: Obstetrics and Gynecology | Admitting: Obstetrics and Gynecology

## 2021-09-26 DIAGNOSIS — Z1231 Encounter for screening mammogram for malignant neoplasm of breast: Secondary | ICD-10-CM

## 2021-09-29 ENCOUNTER — Ambulatory Visit: Payer: No Typology Code available for payment source | Admitting: Psychiatry

## 2021-10-10 NOTE — Progress Notes (Unsigned)
ID:  Morgan Garrett, DOB 01-14-1981, MRN [id number]  PCP:  Vernie Shanks, MD (Inactive)  Cardiologist:  Rex Kras, DO, [facility] (established care 10/11/2021) Former Cardiology Providers: ***  REASON FOR CONSULT: ***  REQUESTING PHYSICIAN:  Faustino Congress, NP [address],  [place] [id number]  No chief complaint on file.   HPI  Morgan Garrett is a 41 y.o. *** female who presents to the clinic for evaluation of *** at the request of Faustino Congress, NP. Her past medical history and cardiovascular risk factors include: ***  ***  History of  Denies prior history of coronary artery disease, myocardial infarction, congestive heart failure, deep venous thrombosis, pulmonary embolism, stroke, transient ischemic attack.  FUNCTIONAL STATUS: ***   ALLERGIES: No Known Allergies  MEDICATION LIST PRIOR TO VISIT: No outpatient medications have been marked as taking for the 10/11/21 encounter (Appointment) with Rex Kras, DO.     PAST MEDICAL HISTORY: Past Medical History:  Diagnosis Date   Anemia    hx   Depression    H/O varicella    Headache(784.0)    migraines   History of shingles    x2   Migraine     PAST SURGICAL HISTORY: Past Surgical History:  Procedure Laterality Date   MOLE REMOVAL     WISDOM TOOTH EXTRACTION      FAMILY HISTORY: The patient family history includes Alzheimer's disease in her maternal grandfather; Breast cancer in her maternal grandmother and paternal grandmother; Cancer in her maternal grandmother, mother, and sister; Headache in her maternal grandmother; Heart failure in her paternal grandfather; Hypertension in her brother and father; Stroke in her maternal grandmother; Yves Dill Parkinson White syndrome in her sister.  SOCIAL HISTORY:  The patient  reports that she has never smoked. She has never used smokeless tobacco. She reports that she does not drink alcohol and does not use drugs.  REVIEW OF SYSTEMS: ROS  PHYSICAL  EXAM:    09/07/2021    9:57 AM 08/03/2021    9:59 AM 05/30/2021    9:13 AM  Vitals with BMI  Height $Remov'5\' 4"'ihxnYp$  $Remove'5\' 4"'hSclyQi$  $RemoveB'5\' 4"'zSDqImIN$   Weight 165 lbs 158 lbs 176 lbs  BMI 28.31 85.92 92.4  Systolic 462 863 817  Diastolic 44 78 69  Pulse   84    CONSTITUTIONAL: Well-developed and well-nourished. No acute distress.  SKIN: Skin is warm and dry. No rash noted. No cyanosis. No pallor. No jaundice HEAD: Normocephalic and atraumatic.  EYES: No scleral icterus MOUTH/THROAT: Moist oral membranes.  NECK: No JVD present. No thyromegaly noted. No carotid bruits  LYMPHATIC: No visible cervical adenopathy.  CHEST Normal respiratory effort. No intercostal retractions  LUNGS: ***Clear to auscultation bilaterally.  No stridor. No wheezes. No rales.  CARDIOVASCULAR: ***Regular rate and rhythm, positive S1-S2, no murmurs rubs or gallops appreciated. ABDOMINAL: *** No apparent ascites.  EXTREMITIES: No peripheral edema, warm to touch, ***DP and PT pulses HEMATOLOGIC: No significant bruising NEUROLOGIC: Oriented to person, place, and time. Nonfocal. Normal muscle tone.  PSYCHIATRIC: Normal mood and affect. Normal behavior. Cooperative  CARDIAC DATABASE: EKG: ***  Echocardiogram: No results found for this or any previous visit from the past 1095 days.    Stress Testing: No results found for this or any previous visit from the past 1095 days.   Heart Catheterization: None  LABORATORY DATA:    Latest Ref Rng & Units 03/11/2012    5:00 AM 03/10/2012    2:15 PM 11/20/2007  5:00 AM  CBC  WBC 4.0 - 10.5 K/uL 14.4  13.4  10.2   Hemoglobin 12.0 - 15.0 g/dL 11.8  13.1  12.2   Hematocrit 36.0 - 46.0 % 34.6  37.8  35.5   Platelets 150 - 400 K/uL 151  170  134 DELTA CHECK NOTED         No data to display          Lipid Panel  No results found for: "CHOL", "TRIG", "HDL", "CHOLHDL", "VLDL", "LDLCALC", "LDLDIRECT", "LABVLDL"  No components found for: "NTPROBNP" No results for input(s): "PROBNP" in the  last 8760 hours. No results for input(s): "TSH" in the last 8760 hours.  BMP No results for input(s): "NA", "K", "CL", "CO2", "GLUCOSE", "BUN", "CREATININE", "CALCIUM", "GFRNONAA", "GFRAA" in the last 8760 hours.  HEMOGLOBIN A1C No results found for: "HGBA1C", "MPG"  External Labs:  Date Collected: 09/23/2021 , information obtained by *** Potassium: *** Creatinine 0.77 mg/dL. eGFR: 98 mL/min per 1.73 m Hemoglobin: 14.3 g/dL and hematocrit: 42.3 % Lipid profile: Total cholesterol *** , triglycerides *** , HDL *** , LDL *** AST: 13 , ALT: 12 , alkaline phosphatase: 75  Hemoglobin A1c: *** TSH: ***    IMPRESSION:  No diagnosis found.   RECOMMENDATIONS: Morgan Garrett is a 41 y.o. *** female whose past medical history and cardiac risk factors include: ***   FINAL MEDICATION LIST END OF ENCOUNTER: No orders of the defined types were placed in this encounter.   There are no discontinued medications.   Current Outpatient Medications:    butalbital-acetaminophen-caffeine (FIORICET, ESGIC) 50-325-40 MG per tablet, Take 2 tablets by mouth 2 (two) times daily as needed. Take two tablets at onset of migraine, then take one tablet every 6 hours as needed for migraine pain, Disp: , Rfl:    meloxicam (MOBIC) 7.5 MG tablet, Take 1 tablet (7.5 mg total) by mouth daily as needed for migraine, Disp: 90 tablet, Rfl: 1   NON FORMULARY, , Disp: , Rfl:    rizatriptan (MAXALT) 10 MG tablet, Take 1 tablet by mouth for migraines as needed as directed, Disp: 12 tablet, Rfl: 6   rizatriptan (MAXALT) 10 MG tablet, TAKE 1 TABLET BY MOUTH FOR MIGRAINES AS NEEDED, Disp: 12 tablet, Rfl: 0   scopolamine (TRANSDERM-SCOP) 1 MG/3DAYS, Apply 1 patch topically every 72 hours and remove old patch., Disp: 4 patch, Rfl: 0   Semaglutide-Weight Management (WEGOVY) 0.25 MG/0.5ML SOAJ, Inject 0.25 mg into the skin once every week, Disp: 4 mL, Rfl: 2   Semaglutide-Weight Management (WEGOVY) 0.5 MG/0.5ML SOAJ, Inject 0.5  mg under the tongue once weekly, Disp: 2 mL, Rfl: 1   Semaglutide-Weight Management (WEGOVY) 1 MG/0.5ML SOAJ, Inject 1 mg into the skin once a  week, Disp: 2 mL, Rfl: 1  No orders of the defined types were placed in this encounter.   There are no Patient Instructions on file for this visit.   --Continue cardiac medications as reconciled in final medication list. --No follow-ups on file. or sooner if needed. --Continue follow-up with your primary care physician regarding the management of your other chronic comorbid conditions.  Patient's questions and concerns were addressed to her satisfaction. She voices understanding of the instructions provided during this encounter.   This note was created using a voice recognition software as a result there may be grammatical errors inadvertently enclosed that do not reflect the nature of this encounter. Every attempt is made to correct such errors.  Sunit Elsah, DO, [facility]  Pager: [phone] Office: [phone]

## 2021-10-11 ENCOUNTER — Ambulatory Visit: Payer: No Typology Code available for payment source | Admitting: Cardiology

## 2021-10-11 ENCOUNTER — Encounter: Payer: Self-pay | Admitting: Cardiology

## 2021-10-11 VITALS — BP 112/63 | HR 104 | Temp 98.7°F | Resp 16 | Ht 64.0 in | Wt 160.6 lb

## 2021-10-11 DIAGNOSIS — R002 Palpitations: Secondary | ICD-10-CM

## 2021-10-11 DIAGNOSIS — R000 Tachycardia, unspecified: Secondary | ICD-10-CM

## 2021-10-11 DIAGNOSIS — I4510 Unspecified right bundle-branch block: Secondary | ICD-10-CM

## 2021-10-17 ENCOUNTER — Inpatient Hospital Stay: Payer: No Typology Code available for payment source

## 2021-10-17 DIAGNOSIS — R002 Palpitations: Secondary | ICD-10-CM

## 2021-10-20 LAB — TSH: TSH: 2.64 u[IU]/mL (ref 0.450–4.500)

## 2021-11-10 ENCOUNTER — Telehealth: Payer: No Typology Code available for payment source | Admitting: Family Medicine

## 2021-11-10 ENCOUNTER — Other Ambulatory Visit (HOSPITAL_COMMUNITY): Payer: Self-pay

## 2021-11-10 DIAGNOSIS — U071 COVID-19: Secondary | ICD-10-CM | POA: Diagnosis not present

## 2021-11-10 MED ORDER — NIRMATRELVIR/RITONAVIR (PAXLOVID)TABLET
3.0000 | ORAL_TABLET | Freq: Two times a day (BID) | ORAL | 0 refills | Status: AC
  Filled 2021-11-10: qty 30, 5d supply, fill #0

## 2021-11-10 MED ORDER — PROMETHAZINE-DM 6.25-15 MG/5ML PO SYRP
5.0000 mL | ORAL_SOLUTION | Freq: Three times a day (TID) | ORAL | 0 refills | Status: DC | PRN
  Filled 2021-11-10: qty 118, 8d supply, fill #0

## 2021-11-10 NOTE — Patient Instructions (Signed)
Morgan Garrett, thank you for joining Morgan Mayo, NP for today's virtual visit.  While this provider is not your primary care provider (PCP), if your PCP is located in our provider database this encounter information will be shared with them immediately following your visit.  Consent: (Patient) Morgan Garrett provided verbal consent for this virtual visit at the beginning of the encounter.  Current Medications:  Current Outpatient Medications:    nirmatrelvir/ritonavir EUA (PAXLOVID) 20 x 150 MG & 10 x '100MG'$  TABS, Take 3 tablets by mouth 2 (two) times daily for 5 days. (Take nirmatrelvir 150 mg two tablets twice daily for 5 days and ritonavir 100 mg one tablet twice daily for 5 days) Patient GFR is 98 09/23/21, Disp: 30 tablet, Rfl: 0   promethazine-dextromethorphan (PROMETHAZINE-DM) 6.25-15 MG/5ML syrup, Take 5 mLs by mouth 3 (three) times daily as needed for cough., Disp: 118 mL, Rfl: 0   Cholecalciferol (VITAMIN D) 50 MCG (2000 UT) CAPS, Take 1 capsule by mouth daily at 12 noon., Disp: , Rfl:    meloxicam (MOBIC) 7.5 MG tablet, Take 1 tablet (7.5 mg total) by mouth daily as needed for migraine, Disp: 90 tablet, Rfl: 1   rizatriptan (MAXALT) 10 MG tablet, Take 1 tablet by mouth for migraines as needed as directed, Disp: 12 tablet, Rfl: 6   Medications ordered in this encounter:  Meds ordered this encounter  Medications   nirmatrelvir/ritonavir EUA (PAXLOVID) 20 x 150 MG & 10 x '100MG'$  TABS    Sig: Take 3 tablets by mouth 2 (two) times daily for 5 days. (Take nirmatrelvir 150 mg two tablets twice daily for 5 days and ritonavir 100 mg one tablet twice daily for 5 days) Patient GFR is 98 09/23/21    Dispense:  30 tablet    Refill:  0    Order Specific Question:   Supervising Provider    Answer:   Noemi Chapel [[id number]]   promethazine-dextromethorphan (PROMETHAZINE-DM) 6.25-15 MG/5ML syrup    Sig: Take 5 mLs by mouth 3 (three) times daily as needed for cough.    Dispense:  118 mL    Refill:  0     Order Specific Question:   Supervising Provider    Answer:   Sabra Heck, BRIAN [[id number]]     *If you need refills on other medications prior to your next appointment, please contact your pharmacy*  Follow-Up: Call back or seek an in-person evaluation if the symptoms worsen or if the condition fails to improve as anticipated.  Other Instructions Please keep well-hydrated and get plenty of rest. Start a saline nasal rinse to flush out your nasal passages. You can use plain Mucinex to help thin congestion. If you have a humidifier, running in the bedroom at night. I want you to start OTC vitamin D3 1000 units daily, vitamin C 1000 mg daily, and a zinc supplement. Please take prescribed medications as directed.  You have been enrolled in a MyChart symptom monitoring program. Please answer these questions daily so we can keep track of how you are doing.  You were to quarantine for 5 days from onset of your symptoms.  After day 5, if you have had no fever and you are feeling better, you can end quarantine but need to mask for an additional 5 days. After day 5 if you have a fever or are having significant symptoms, please quarantine for full 10 days.  If you note any worsening of symptoms, any significant shortness of breath or any chest  pain, please seek ER evaluation ASAP.  Please do not delay care!  COVID-19: What to Do if You Are Sick If you test positive and are an older adult or someone who is at high risk of getting very sick from COVID-19, treatment may be available. Contact a healthcare provider right away after a positive test to determine if you are eligible, even if your symptoms are mild right now. You can also visit a Test to Treat location and, if eligible, receive a prescription from a provider. Don't delay: Treatment must be started within the first few days to be effective. If you have a fever, cough, or other symptoms, you might have COVID-19. Most people have mild illness and are  able to recover at home. If you are sick: Keep track of your symptoms. If you have an emergency warning sign (including trouble breathing), call 911. Steps to help prevent the spread of COVID-19 if you are sick If you are sick with COVID-19 or think you might have COVID-19, follow the steps below to care for yourself and to help protect other people in your home and community. Stay home except to get medical care Stay home. Most people with COVID-19 have mild illness and can recover at home without medical care. Do not leave your home, except to get medical care. Do not visit public areas and do not go to places where you are unable to wear a mask. Take care of yourself. Get rest and stay hydrated. Take over-the-counter medicines, such as acetaminophen, to help you feel better. Stay in touch with your doctor. Call before you get medical care. Be sure to get care if you have trouble breathing, or have any other emergency warning signs, or if you think it is an emergency. Avoid public transportation, ride-sharing, or taxis if possible. Get tested If you have symptoms of COVID-19, get tested. While waiting for test results, stay away from others, including staying apart from those living in your household. Get tested as soon as possible after your symptoms start. Treatments may be available for people with COVID-19 who are at risk for becoming very sick. Don't delay: Treatment must be started early to be effective--some treatments must begin within 5 days of your first symptoms. Contact your healthcare provider right away if your test result is positive to determine if you are eligible. Self-tests are one of several options for testing for the virus that causes COVID-19 and may be more convenient than laboratory-based tests and point-of-care tests. Ask your healthcare provider or your local health department if you need help interpreting your test results. You can visit your state, tribal, local, and  territorial health department's website to look for the latest local information on testing sites. Separate yourself from other people As much as possible, stay in a specific room and away from other people and pets in your home. If possible, you should use a separate bathroom. If you need to be around other people or animals in or outside of the home, wear a well-fitting mask. Tell your close contacts that they may have been exposed to COVID-19. An infected person can spread COVID-19 starting 48 hours (or 2 days) before the person has any symptoms or tests positive. By letting your close contacts know they may have been exposed to COVID-19, you are helping to protect everyone. See COVID-19 and Animals if you have questions about pets. If you are diagnosed with COVID-19, someone from the health department may call you. Answer the call to  slow the spread. Monitor your symptoms Symptoms of COVID-19 include fever, cough, or other symptoms. Follow care instructions from your healthcare provider and local health department. Your local health authorities may give instructions on checking your symptoms and reporting information. When to seek emergency medical attention Look for emergency warning signs* for COVID-19. If someone is showing any of these signs, seek emergency medical care immediately: Trouble breathing Persistent pain or pressure in the chest New confusion Inability to wake or stay awake Pale, gray, or blue-colored skin, lips, or nail beds, depending on skin tone *This list is not all possible symptoms. Please call your medical provider for any other symptoms that are severe or concerning to you. Call 911 or call ahead to your local emergency facility: Notify the operator that you are seeking care for someone who has or may have COVID-19. Call ahead before visiting your doctor Call ahead. Many medical visits for routine care are being postponed or done by phone or telemedicine. If you have  a medical appointment that cannot be postponed, call your doctor's office, and tell them you have or may have COVID-19. This will help the office protect themselves and other patients. If you are sick, wear a well-fitting mask You should wear a mask if you must be around other people or animals, including pets (even at home). Wear a mask with the best fit, protection, and comfort for you. You don't need to wear the mask if you are alone. If you can't put on a mask (because of trouble breathing, for example), cover your coughs and sneezes in some other way. Try to stay at least 6 feet away from other people. This will help protect the people around you. Masks should not be placed on young children under age 70 years, anyone who has trouble breathing, or anyone who is not able to remove the mask without help. Cover your coughs and sneezes Cover your mouth and nose with a tissue when you cough or sneeze. Throw away used tissues in a lined trash can. Immediately wash your hands with soap and water for at least 20 seconds. If soap and water are not available, clean your hands with an alcohol-based hand sanitizer that contains at least 60% alcohol. Clean your hands often Wash your hands often with soap and water for at least 20 seconds. This is especially important after blowing your nose, coughing, or sneezing; going to the bathroom; and before eating or preparing food. Use hand sanitizer if soap and water are not available. Use an alcohol-based hand sanitizer with at least 60% alcohol, covering all surfaces of your hands and rubbing them together until they feel dry. Soap and water are the best option, especially if hands are visibly dirty. Avoid touching your eyes, nose, and mouth with unwashed hands. Handwashing Tips Avoid sharing personal household items Do not share dishes, drinking glasses, cups, eating utensils, towels, or bedding with other people in your home. Wash these items thoroughly after  using them with soap and water or put in the dishwasher. Clean surfaces in your home regularly Clean and disinfect high-touch surfaces (for example, doorknobs, tables, handles, light switches, and countertops) in your "sick room" and bathroom. In shared spaces, you should clean and disinfect surfaces and items after each use by the person who is ill. If you are sick and cannot clean, a caregiver or other person should only clean and disinfect the area around you (such as your bedroom and bathroom) on an as needed basis. Your caregiver/other  person should wait as long as possible (at least several hours) and wear a mask before entering, cleaning, and disinfecting shared spaces that you use. Clean and disinfect areas that may have blood, stool, or body fluids on them. Use household cleaners and disinfectants. Clean visible dirty surfaces with household cleaners containing soap or detergent. Then, use a household disinfectant. Use a product from [employer] List N: Disinfectants for Coronavirus (ZJIRC-78). Be sure to follow the instructions on the label to ensure safe and effective use of the product. Many products recommend keeping the surface wet with a disinfectant for a certain period of time (look at "contact time" on the product label). You may also need to wear personal protective equipment, such as gloves, depending on the directions on the product label. Immediately after disinfecting, wash your hands with soap and water for 20 seconds. For completed guidance on cleaning and disinfecting your home, visit Complete Disinfection Guidance. Take steps to improve ventilation at home Improve ventilation (air flow) at home to help prevent from spreading COVID-19 to other people in your household. Clear out COVID-19 virus particles in the air by opening windows, using air filters, and turning on fans in your home. Use this interactive tool to learn how to improve air flow in your home. When you can be around  others after being sick with COVID-19 Deciding when you can be around others is different for different situations. Find out when you can safely end home isolation. For any additional questions about your care, contact your healthcare provider or state or local health department. 06/08/2020 Content source: [facility] ([facility]), Division of Viral Diseases This information is not intended to replace advice given to you by your health care provider. Make sure you discuss any questions you have with your health care provider. Document Revised: 07/22/2020 Document Reviewed: 07/22/2020 Elsevier Patient Education  2022 [employer].      If you have been instructed to have an in-person evaluation today at a local Urgent Care facility, please use the link below. It will take you to a list of all of our available [facility], including address, phone number and hours of operation. Please do not delay care.  [facility]  If you or a family member do not have a primary care provider, use the link below to schedule a visit and establish care. When you choose a [facility] primary care physician or advanced practice provider, you gain a long-term partner in health. Find a Primary Care Provider  Learn more about [facility]'s in-office and virtual care options: [facility]

## 2021-11-10 NOTE — Progress Notes (Signed)
Virtual Visit Consent   Morgan Garrett, you are scheduled for a virtual visit with a [facility] provider today. Just as with appointments in the office, your consent must be obtained to participate. Your consent will be active for this visit and any virtual visit you may have with one of our providers in the next 365 days. If you have a MyChart account, a copy of this consent can be sent to you electronically.  As this is a virtual visit, video technology does not allow for your provider to perform a traditional examination. This may limit your provider's ability to fully assess your condition. If your provider identifies any concerns that need to be evaluated in person or the need to arrange testing (such as labs, EKG, etc.), we will make arrangements to do so. Although advances in technology are sophisticated, we cannot ensure that it will always work on either your end or our end. If the connection with a video visit is poor, the visit may have to be switched to a telephone visit. With either a video or telephone visit, we are not always able to ensure that we have a secure connection.  By engaging in this virtual visit, you consent to the provision of healthcare and authorize for your insurance to be billed (if applicable) for the services provided during this visit. Depending on your insurance coverage, you may receive a charge related to this service.  I need to obtain your verbal consent now. Are you willing to proceed with your visit today? Morgan Garrett has provided verbal consent on 11/10/2021 for a virtual visit (video or telephone). Perlie Mayo, NP  Date: 11/10/2021 9:45 AM  Virtual Visit via Video Note   I, Perlie Mayo, connected with  Morgan Garrett  ([id number], [birth year]) on 11/10/21 at  9:45 AM EDT by a video-enabled telemedicine application and verified that I am speaking with the correct person using two identifiers.  Location: Patient: Virtual Visit Location Patient:  Home Provider: Virtual Visit Location Provider: Home Office   I discussed the limitations of evaluation and management by telemedicine and the availability of in person appointments. The patient expressed understanding and agreed to proceed.    History of Present Illness: Morgan Garrett is a 41 y.o. who identifies as a female who was assigned female at birth, and is being seen today for Covid + on work test. Is an employee of [employer]. Symptoms started on Monday with congestion. Husband was having symptoms as well- tested Positive and children have also.  Currently having fever tmax of 101- tylenol and ibuprofen use Congestion, sore throat, and cough- dry. Muscle aches and some fatigue.  Is also taking some vitamins  Had covid in 2020 prior to vaccines.  Has had vaccines.  Denies chest pain, shortness of breath or ear pain.  Given some recently concerns for heart rate issues we will proceed with antiviral as window is close to closing for starting it.     Problems:  Patient Active Problem List   Diagnosis Date Noted   Migraine 05/30/2021    Allergies: No Known Allergies Medications:  Current Outpatient Medications:    Cholecalciferol (VITAMIN D) 50 MCG (2000 UT) CAPS, Take 1 capsule by mouth daily at 12 noon., Disp: , Rfl:    meloxicam (MOBIC) 7.5 MG tablet, Take 1 tablet (7.5 mg total) by mouth daily as needed for migraine, Disp: 90 tablet, Rfl: 1   rizatriptan (MAXALT) 10 MG tablet, Take 1 tablet  by mouth for migraines as needed as directed, Disp: 12 tablet, Rfl: 6  Observations/Objective: Patient is well-developed, well-nourished in no acute distress, visibly tired.  Resting comfortably  at home.  Head is normocephalic, atraumatic.  No labored breathing.  Speech is clear and coherent with logical content.  Patient is alert and oriented at baseline.  Dry cough noted and nasal tone  Assessment and Plan:  1. COVID-19  - nirmatrelvir/ritonavir EUA (PAXLOVID) 20 x 150 MG  & 10 x '100MG'$  TABS; Take 3 tablets by mouth 2 (two) times daily for 5 days. (Take nirmatrelvir 150 mg two tablets twice daily for 5 days and ritonavir 100 mg one tablet twice daily for 5 days) Patient GFR is 98 09/23/21  Dispense: 30 tablet; Refill: 0 - promethazine-dextromethorphan (PROMETHAZINE-DM) 6.25-15 MG/5ML syrup; Take 5 mLs by mouth 3 (three) times daily as needed for cough.  Dispense: 118 mL; Refill: 0  -rest -hydration -given current follow up with HR issues and concerns antiviral is discussed and desired Labs from [facility] reviewed with patient on phone GFR is 98 as of 09/23/21 -Covid info on AVS as well    Reviewed side effects, risks and benefits of medication.    Patient acknowledged agreement and understanding of the plan.   Past Medical, Surgical, Social History, Allergies, and Medications have been Reviewed.    Follow Up Instructions: I discussed the assessment and treatment plan with the patient. The patient was provided an opportunity to ask questions and all were answered. The patient agreed with the plan and demonstrated an understanding of the instructions.  A copy of instructions were sent to the patient via MyChart unless otherwise noted below.     The patient was advised to call back or seek an in-person evaluation if the symptoms worsen or if the condition fails to improve as anticipated.  Time:  I spent 17 minutes with the patient via telehealth technology discussing the above problems/concerns.    Perlie Mayo, NP

## 2021-11-18 ENCOUNTER — Ambulatory Visit: Payer: No Typology Code available for payment source

## 2021-11-18 DIAGNOSIS — R000 Tachycardia, unspecified: Secondary | ICD-10-CM

## 2021-11-18 DIAGNOSIS — I4510 Unspecified right bundle-branch block: Secondary | ICD-10-CM

## 2021-11-25 ENCOUNTER — Ambulatory Visit: Payer: No Typology Code available for payment source | Admitting: Cardiology

## 2021-11-28 ENCOUNTER — Other Ambulatory Visit (HOSPITAL_COMMUNITY): Payer: Self-pay

## 2021-12-13 ENCOUNTER — Ambulatory Visit: Payer: No Typology Code available for payment source | Admitting: Cardiology

## 2021-12-13 ENCOUNTER — Encounter: Payer: Self-pay | Admitting: Cardiology

## 2021-12-13 VITALS — BP 120/68 | HR 80 | Temp 98.2°F | Resp 16 | Ht 64.0 in | Wt 164.6 lb

## 2021-12-13 DIAGNOSIS — I4510 Unspecified right bundle-branch block: Secondary | ICD-10-CM

## 2021-12-13 DIAGNOSIS — R002 Palpitations: Secondary | ICD-10-CM

## 2021-12-13 NOTE — Progress Notes (Signed)
ID:  Morgan Garrett, DOB Jul 30, 1980, MRN [id number]  PCP:  [facility] @ [place]  Cardiologist:  Rex Kras, DO, [facility] [place] (established care 10/11/2021)  Date: 12/13/21 Last Office Visit: 10/11/2021  Chief Complaint  Patient presents with   Palpitations   Follow-up    HPI  Morgan Garrett is a 41 y.o. Caucasian female whose past medical history and cardiovascular risk factors include: Migraines, depression, history of shingles, history of COVID-19 infection.  Patient was referred to the practice for evaluation of palpitations.  No identifiable reversible cause and therefore the shared decision was to proceed with lab work, echocardiogram, GXT, and a cardiac monitor.  Since last office visit patient states that the palpitations have improved in intensity, frequency, and duration.  No near-syncope or syncopal events.  Results of the diagnostic work-up reviewed with the patient at today's office visit and noted below for further reference.  FUNCTIONAL STATUS: Patient enjoys doing Pilates 3-5 to per week  for 60 minutes per session.   ALLERGIES: No Known Allergies  MEDICATION LIST PRIOR TO VISIT: Current Meds  Medication Sig   Cholecalciferol (VITAMIN D) 50 MCG (2000 UT) CAPS Take 1 capsule by mouth daily at 12 noon.   meloxicam (MOBIC) 7.5 MG tablet Take 1 tablet (7.5 mg total) by mouth daily as needed for migraine   rizatriptan (MAXALT) 10 MG tablet Take 1 tablet by mouth for migraines as needed as directed     PAST MEDICAL HISTORY: Past Medical History:  Diagnosis Date   Anemia    hx   Depression    H/O varicella    Headache(784.0)    migraines   History of shingles    x2   Migraine     PAST SURGICAL HISTORY: Past Surgical History:  Procedure Laterality Date   MOLE REMOVAL     WISDOM TOOTH EXTRACTION      FAMILY HISTORY: The patient family history includes Alzheimer's disease in her maternal grandfather; Breast cancer in her maternal  grandmother and paternal grandmother; Cancer in her maternal grandmother, mother, and sister; Headache in her maternal grandmother; Heart failure in her paternal grandfather; Hypertension in her brother and father; Stroke in her maternal grandmother; Yves Dill Parkinson White syndrome in her sister.  SOCIAL HISTORY:  The patient  reports that she has never smoked. She has never used smokeless tobacco. She reports current alcohol use. She reports that she does not use drugs.  REVIEW OF SYSTEMS: Review of Systems  Cardiovascular:  Negative for chest pain, claudication, dyspnea on exertion, irregular heartbeat, leg swelling, near-syncope, orthopnea, palpitations, paroxysmal nocturnal dyspnea and syncope.  Respiratory:  Negative for shortness of breath.   Hematologic/Lymphatic: Negative for bleeding problem.  Musculoskeletal:  Negative for muscle cramps and myalgias.  Neurological:  Negative for dizziness and light-headedness.   PHYSICAL EXAM:    12/13/2021   11:05 AM 12/13/2021   10:24 AM 10/11/2021    8:52 AM  Vitals with BMI  Height  5' 4"  5' 4"   Weight  164 lbs 10 oz 160 lbs 10 oz  BMI  98.92 11.94  Systolic 174 081 448  Diastolic 68 48 63  Pulse 80 89 104   Physical Exam  Constitutional: She appears healthy.  Neck: No JVD present.  Cardiovascular: Regular rhythm, S1 normal, S2 normal, intact distal pulses and normal pulses. Tachycardia present. Exam reveals no gallop, no S3 and no S4.  No murmur heard. Pulses:      Radial pulses are 2+ on the  right side and 2+ on the left side.       Dorsalis pedis pulses are 2+ on the right side and 2+ on the left side.       Posterior tibial pulses are 2+ on the right side and 2+ on the left side.  Pulmonary/Chest: Effort normal and breath sounds normal. No stridor. She has no wheezes. She has no rales. She exhibits no tenderness.  Abdominal: Soft. Bowel sounds are normal. She exhibits no distension. There is no abdominal tenderness.   Musculoskeletal:        General: No edema.     Cervical back: Normal range of motion and neck supple.  Neurological: She is alert and oriented to person, place, and time.  Skin: Skin is warm and moist.   CARDIAC DATABASE: EKG: 10/11/2021: NSR, 100 bpm, incomplete right bundle branch block, without underlying ischemia injury pattern.   Echocardiogram: 11/18/2021: Normal LV systolic function with visual EF 65-70%. Left ventricle cavity is normal in size. Normal left ventricular wall thickness. Normal global wall motion. Normal diastolic filling pattern, normal LAP. Calculated EF 71%. Structurally normal tricuspid valve with trace regurgitation. No evidence of pulmonary hypertension. no prior available for comparison  Stress Testing: Exercise treadmill stress test 11/18/2021: Exercise treadmill stress test performed using Bruce protocol.  Patient reached 8.6 METS, and 106% of age predicted maximum heart rate.  Exercise capacity was good.  No chest pain reported.  Normal heart rate and hemodynamic response. Stress EKG revealed no ischemic changes. Low risk study.  Heart Catheterization: None  Cardiac monitor (Zio Patch): October 17, 2021-October 31, 2021 Dominant rhythm sinus, followed by tachycardia (16% burden). Heart rate 49-163 bpm.  Avg HR 87 bpm. No atrial fibrillation, high grade AV block, pauses (3 seconds or longer). Total ventricular ectopic burden <1%. Total supraventricular ectopic burden <1%. Rare episodes of supraventricular tachycardia. 1 asymptomatic/auto triggered event illustrating NSVT, 4 beats, 2 seconds in duration, max heart rate 156 bpm, average heart rate 149 bpm.   Patient triggered events: 29.  Underlying rhythm either sinus or sinus tachycardia with rare PACs/PVCs.  LABORATORY DATA: External Labs:  Date Collected: 09/23/2021 , information obtained by referring physician Potassium: 4.3 Creatinine 0.77 mg/dL. eGFR: 98 mL/min per 1.73 m Hemoglobin: 14.3 g/dL  and hematocrit: 42.3 % AST: 13 , ALT: 12 , alkaline phosphatase: 75   Lipid Panel w/reflex   2021-07-19    LDL Chol Calc (NIH) 106   0-99  CHOL/HDL 4.3   2.0-4.0  Cholesterol 157   <200  HDLD 37   30-85  LDL Chol Calc (NIH) 106   0-99  NHDL 120   0-129  Triglyceride 73   0-199  Vitamin D 25(OH) Total   2021-07-19    25(OH) Vit D, Total 34.6   30.0-100.0   IMPRESSION:    ICD-10-CM   1. Palpitations  R00.2     2. Incomplete RBBB  I45.10        RECOMMENDATIONS: AYAAN RINGLE is a 42 y.o. Caucasian female whose past medical history and cardiac risk factors include: Migraines, depression, history of shingles, history of COVID-19 infection.  Patient was referred to the practice for evaluation of palpitations.  Since last office visit she has undergone a very comprehensive cardiovascular work-up.  TSH within normal limits.  Zio patch results reviewed with the patient including the rhythm strips.  The underlying rhythm is sinus.  No significant ectopic burden.  Patient triggered events illustrates underlying rhythm to be sinus.  One auto  triggered episode of NSVT 4 beats, approximately 2 seconds in duration, while asleep.  Patient has not had any prior work-up for sleep apnea.  And states that she does not snore at night (per husband).  Her GXT was overall low risk.  LVEF is preserved, no significant valvular heart disease, and GXT overall a low risk study with good chronotropic competence.  No additional cardiovascular testing is warranted at this time.  Educated on the importance of improving her modifiable cardiovascular risk factors for primary prevention.  Overall symptoms of palpitations have also improved in intensity, frequency, duration.  We will hold off on pharmacological therapy for now unless a change in clinical status.  Patient is agreeable with the plan of care.  FINAL MEDICATION LIST END OF ENCOUNTER: No orders of the defined types were placed in this encounter.    Medications Discontinued During This Encounter  Medication Reason   promethazine-dextromethorphan (PROMETHAZINE-DM) 6.25-15 MG/5ML syrup      Current Outpatient Medications:    Cholecalciferol (VITAMIN D) 50 MCG (2000 UT) CAPS, Take 1 capsule by mouth daily at 12 noon., Disp: , Rfl:    meloxicam (MOBIC) 7.5 MG tablet, Take 1 tablet (7.5 mg total) by mouth daily as needed for migraine, Disp: 90 tablet, Rfl: 1   rizatriptan (MAXALT) 10 MG tablet, Take 1 tablet by mouth for migraines as needed as directed, Disp: 12 tablet, Rfl: 6  No orders of the defined types were placed in this encounter.   There are no Patient Instructions on file for this visit.   --Continue cardiac medications as reconciled in final medication list. --Return if symptoms worsen or fail to improve. or sooner if needed. --Continue follow-up with your primary care physician regarding the management of your other chronic comorbid conditions.  Patient's questions and concerns were addressed to her satisfaction. She voices understanding of the instructions provided during this encounter.   This note was created using a voice recognition software as a result there may be grammatical errors inadvertently enclosed that do not reflect the nature of this encounter. Every attempt is made to correct such errors.  Rex Kras, [place], [facility]  Pager: [phone] Office: [phone]

## 2021-12-27 ENCOUNTER — Other Ambulatory Visit (HOSPITAL_COMMUNITY): Payer: Self-pay

## 2021-12-27 MED ORDER — CLOBETASOL PROPIONATE 0.05 % EX CREA
TOPICAL_CREAM | Freq: Two times a day (BID) | CUTANEOUS | 0 refills | Status: AC
  Filled 2021-12-27: qty 45, 14d supply, fill #0

## 2021-12-30 ENCOUNTER — Other Ambulatory Visit (HOSPITAL_COMMUNITY): Payer: Self-pay

## 2022-01-02 ENCOUNTER — Other Ambulatory Visit (HOSPITAL_COMMUNITY): Payer: Self-pay

## 2022-01-02 ENCOUNTER — Telehealth: Payer: No Typology Code available for payment source | Admitting: Nurse Practitioner

## 2022-01-02 DIAGNOSIS — J40 Bronchitis, not specified as acute or chronic: Secondary | ICD-10-CM | POA: Diagnosis not present

## 2022-01-02 MED ORDER — BENZONATATE 100 MG PO CAPS
100.0000 mg | ORAL_CAPSULE | Freq: Three times a day (TID) | ORAL | 0 refills | Status: AC | PRN
  Filled 2022-01-02: qty 30, 10d supply, fill #0

## 2022-01-02 MED ORDER — AZITHROMYCIN 250 MG PO TABS
ORAL_TABLET | ORAL | 0 refills | Status: AC
  Filled 2022-01-02: qty 6, 5d supply, fill #0

## 2022-01-02 NOTE — Progress Notes (Signed)
We are sorry that you are not feeling well.  Here is how we plan to help!  Based on your presentation I believe you most likely have A cough due to bacteria.  When patients have a fever and a productive cough with a change in color or increased sputum production, we are concerned about bacterial bronchitis.  If left untreated it can progress to pneumonia.  If your symptoms do not improve with your treatment plan it is important that you contact your provider.   I have prescribed Azithromyin 250 mg: two tablets now and then one tablet daily for 4 additonal days    In addition you may use A prescription cough medication called Tessalon Perles 100mg. You may take 1-2 capsules every 8 hours as needed for your cough.   From your responses in the eVisit questionnaire you describe inflammation in the upper respiratory tract which is causing a significant cough.  This is commonly called Bronchitis and has four common causes:   Allergies Viral Infections Acid Reflux Bacterial Infection Allergies, viruses and acid reflux are treated by controlling symptoms or eliminating the cause. An example might be a cough caused by taking certain blood pressure medications. You stop the cough by changing the medication. Another example might be a cough caused by acid reflux. Controlling the reflux helps control the cough.  USE OF BRONCHODILATOR ("RESCUE") INHALERS: There is a risk from using your bronchodilator too frequently.  The risk is that over-reliance on a medication which only relaxes the muscles surrounding the breathing tubes can reduce the effectiveness of medications prescribed to reduce swelling and congestion of the tubes themselves.  Although you feel brief relief from the bronchodilator inhaler, your asthma may actually be worsening with the tubes becoming more swollen and filled with mucus.  This can delay other crucial treatments, such as oral steroid medications. If you need to use a bronchodilator  inhaler daily, several times per day, you should discuss this with your provider.  There are probably better treatments that could be used to keep your asthma under control.     HOME CARE Only take medications as instructed by your medical team. Complete the entire course of an antibiotic. Drink plenty of fluids and get plenty of rest. Avoid close contacts especially the very young and the elderly Cover your mouth if you cough or cough into your sleeve. Always remember to wash your hands A steam or ultrasonic humidifier can help congestion.   GET HELP RIGHT AWAY IF: You develop worsening fever. You become short of breath You cough up blood. Your symptoms persist after you have completed your treatment plan MAKE SURE YOU  Understand these instructions. Will watch your condition. Will get help right away if you are not doing well or get worse.    Thank you for choosing an e-visit.  Your e-visit answers were reviewed by a board certified advanced clinical practitioner to complete your personal care plan. Depending upon the condition, your plan could have included both over the counter or prescription medications.  Please review your pharmacy choice. Make sure the pharmacy is open so you can pick up prescription now. If there is a problem, you may contact your provider through MyChart messaging and have the prescription routed to another pharmacy.  Your safety is important to us. If you have drug allergies check your prescription carefully.   For the next 24 hours you can use MyChart to ask questions about today's visit, request a non-urgent call back, or ask   for a work or school excuse. You will get an email in the next two days asking about your experience. I hope that your e-visit has been valuable and will speed your recovery.   Meds ordered this encounter  Medications   azithromycin (ZITHROMAX) 250 MG tablet    Sig: Take 2 tablets on day 1, then 1 tablet daily on days 2 through 5     Dispense:  6 tablet    Refill:  0   benzonatate (TESSALON) 100 MG capsule    Sig: Take 1 capsule (100 mg total) by mouth 3 (three) times daily as needed.    Dispense:  30 capsule    Refill:  0    I spent approximately 5 minutes reviewing the patient's history, current symptoms and coordinating their plan of care today.   

## 2022-01-06 ENCOUNTER — Other Ambulatory Visit (HOSPITAL_COMMUNITY): Payer: Self-pay

## 2022-04-12 ENCOUNTER — Other Ambulatory Visit (HOSPITAL_COMMUNITY): Payer: Self-pay

## 2022-05-08 ENCOUNTER — Other Ambulatory Visit (HOSPITAL_COMMUNITY): Payer: Self-pay

## 2022-07-25 ENCOUNTER — Other Ambulatory Visit: Payer: Self-pay

## 2022-08-02 DIAGNOSIS — E7800 Pure hypercholesterolemia, unspecified: Secondary | ICD-10-CM | POA: Diagnosis not present

## 2022-08-02 DIAGNOSIS — Z5181 Encounter for therapeutic drug level monitoring: Secondary | ICD-10-CM | POA: Diagnosis not present

## 2022-08-02 DIAGNOSIS — E559 Vitamin D deficiency, unspecified: Secondary | ICD-10-CM | POA: Diagnosis not present

## 2022-08-02 DIAGNOSIS — Z23 Encounter for immunization: Secondary | ICD-10-CM | POA: Diagnosis not present

## 2022-08-02 DIAGNOSIS — H538 Other visual disturbances: Secondary | ICD-10-CM | POA: Diagnosis not present

## 2022-08-02 DIAGNOSIS — Z0000 Encounter for general adult medical examination without abnormal findings: Secondary | ICD-10-CM | POA: Diagnosis not present

## 2022-08-02 DIAGNOSIS — R5383 Other fatigue: Secondary | ICD-10-CM | POA: Diagnosis not present

## 2022-08-18 DIAGNOSIS — H5212 Myopia, left eye: Secondary | ICD-10-CM | POA: Diagnosis not present

## 2022-08-18 DIAGNOSIS — H524 Presbyopia: Secondary | ICD-10-CM | POA: Diagnosis not present

## 2022-08-18 DIAGNOSIS — H52223 Regular astigmatism, bilateral: Secondary | ICD-10-CM | POA: Diagnosis not present

## 2022-10-02 ENCOUNTER — Other Ambulatory Visit: Payer: Self-pay | Admitting: Obstetrics and Gynecology

## 2022-10-02 ENCOUNTER — Other Ambulatory Visit (HOSPITAL_COMMUNITY): Payer: Self-pay

## 2022-10-02 DIAGNOSIS — Z1231 Encounter for screening mammogram for malignant neoplasm of breast: Secondary | ICD-10-CM

## 2022-10-02 MED ORDER — HYDROCHLOROTHIAZIDE 12.5 MG PO CAPS
12.5000 mg | ORAL_CAPSULE | Freq: Every day | ORAL | 1 refills | Status: DC
  Filled 2022-10-02: qty 90, 90d supply, fill #0

## 2022-10-03 ENCOUNTER — Ambulatory Visit
Admission: RE | Admit: 2022-10-03 | Discharge: 2022-10-03 | Disposition: A | Discharge status: Home / Self Care | Payer: Managed Care, Other (non HMO) | Source: Ambulatory Visit | Attending: Obstetrics and Gynecology | Admitting: Obstetrics and Gynecology

## 2022-10-03 DIAGNOSIS — Z1231 Encounter for screening mammogram for malignant neoplasm of breast: Secondary | ICD-10-CM

## 2023-08-20 ENCOUNTER — Other Ambulatory Visit: Payer: Self-pay | Admitting: Obstetrics and Gynecology

## 2023-08-20 DIAGNOSIS — Z1231 Encounter for screening mammogram for malignant neoplasm of breast: Secondary | ICD-10-CM

## 2023-10-04 ENCOUNTER — Ambulatory Visit
Admission: RE | Admit: 2023-10-04 | Discharge: 2023-10-04 | Disposition: A | Discharge status: Home / Self Care | Source: Ambulatory Visit | Attending: Obstetrics and Gynecology | Admitting: Obstetrics and Gynecology

## 2023-10-04 DIAGNOSIS — Z1231 Encounter for screening mammogram for malignant neoplasm of breast: Secondary | ICD-10-CM

## 2023-11-07 ENCOUNTER — Other Ambulatory Visit: Payer: Self-pay

## 2023-11-07 ENCOUNTER — Other Ambulatory Visit (HOSPITAL_COMMUNITY): Payer: Self-pay

## 2023-11-07 MED ORDER — PROBENECID 500 MG PO TABS
1000.0000 mg | ORAL_TABLET | Freq: Two times a day (BID) | ORAL | 0 refills | Status: AC
  Filled 2023-11-07: qty 20, 5d supply, fill #0

## 2023-11-07 MED ORDER — METFORMIN HCL 500 MG PO TABS
ORAL_TABLET | ORAL | 0 refills | Status: DC
  Filled 2023-11-07: qty 52, 17d supply, fill #0

## 2023-11-07 MED ORDER — HYDROXYCHLOROQUINE SULFATE 200 MG PO TABS
200.0000 mg | ORAL_TABLET | Freq: Two times a day (BID) | ORAL | 0 refills | Status: AC
  Filled 2023-11-07: qty 10, 5d supply, fill #0

## 2023-11-16 ENCOUNTER — Telehealth: Admitting: Physician Assistant

## 2023-11-16 DIAGNOSIS — T7840XA Allergy, unspecified, initial encounter: Secondary | ICD-10-CM

## 2023-11-17 ENCOUNTER — Other Ambulatory Visit (HOSPITAL_COMMUNITY): Payer: Self-pay

## 2023-11-17 IMAGING — CR DG CERVICAL SPINE COMPLETE 4+V
7 series · 7 of 7 positions shown · non-contrast
Comparison: None.

CLINICAL DATA: Neck pain radiating bilaterally into shoulders and
into skull

EXAM:
CERVICAL SPINE - COMPLETE 4+ VIEW

[w cervical spine lat]
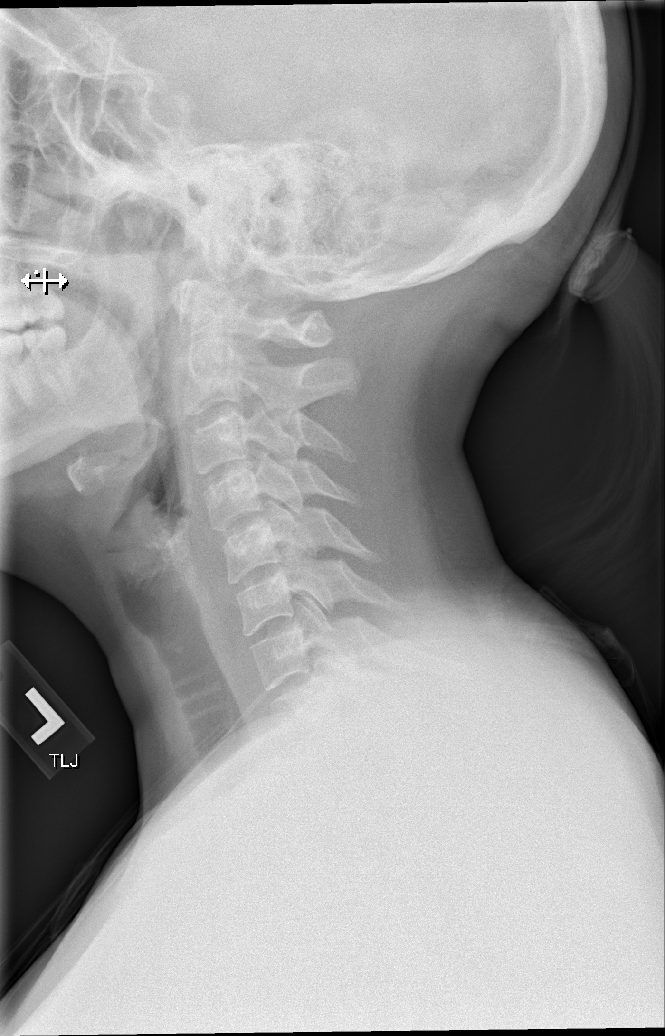

[w cervical spine ap_obl (1 of 2)]
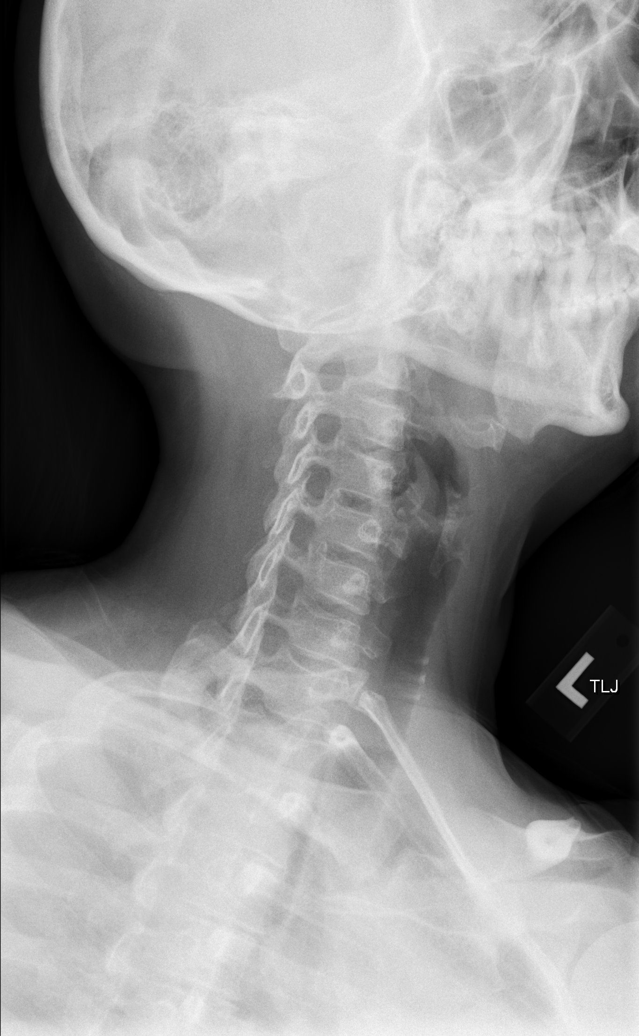

[w cervical spine ap_obl (2 of 2)]
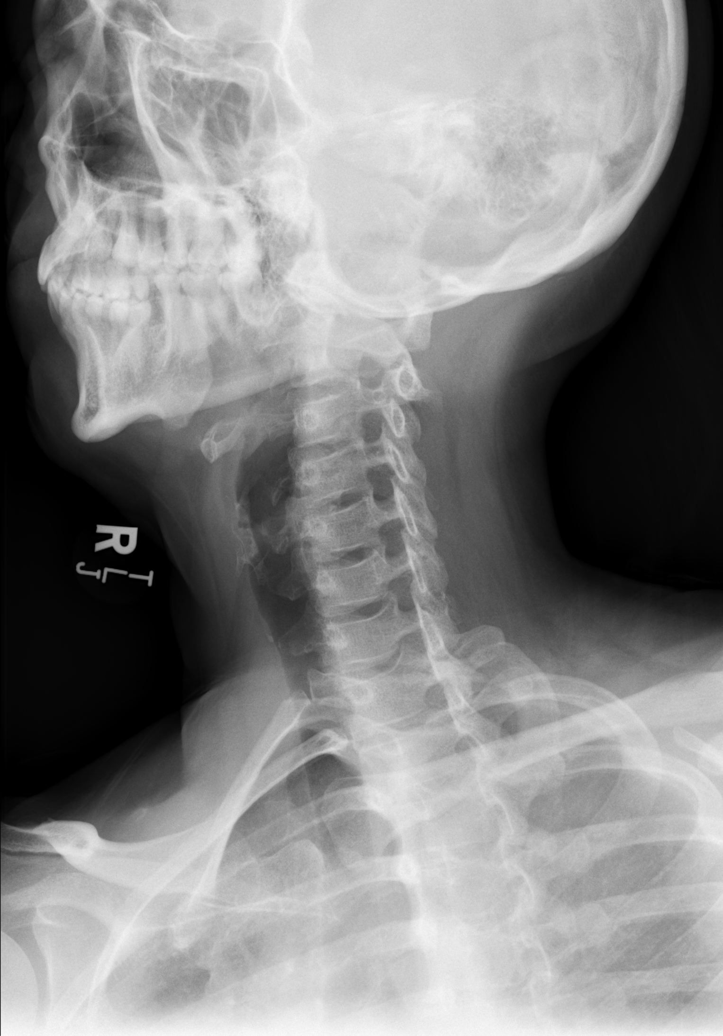

[w cervical spine ap]
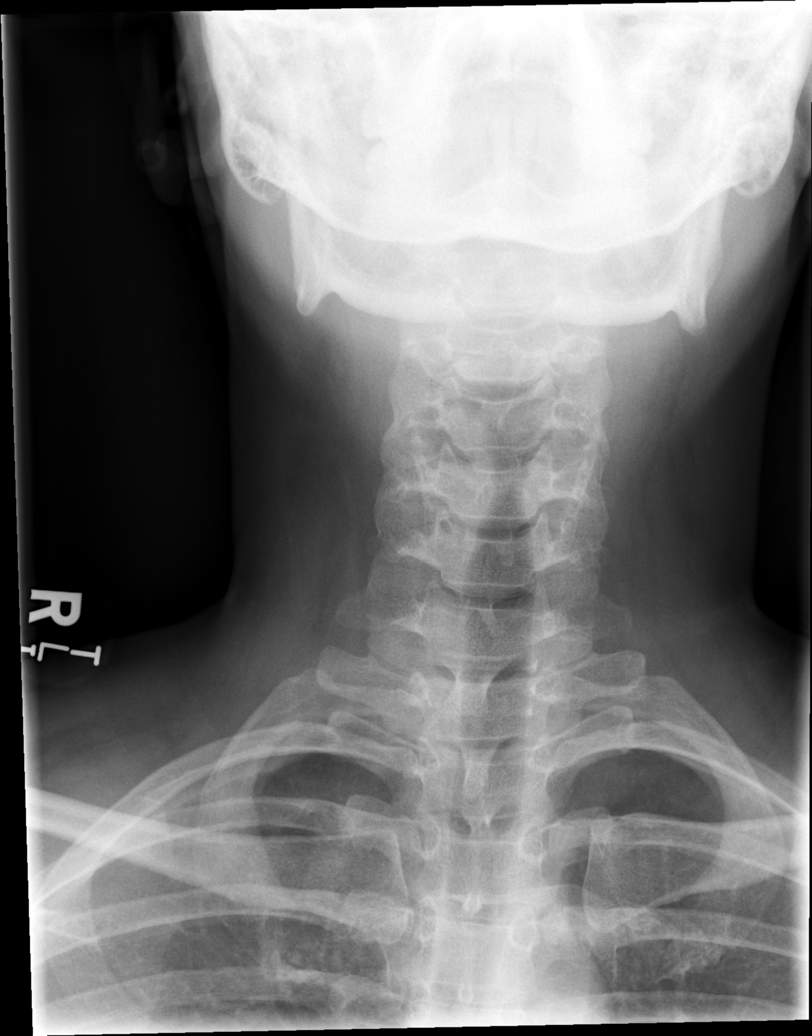

[w cervical spine odontoid (1 of 3)]
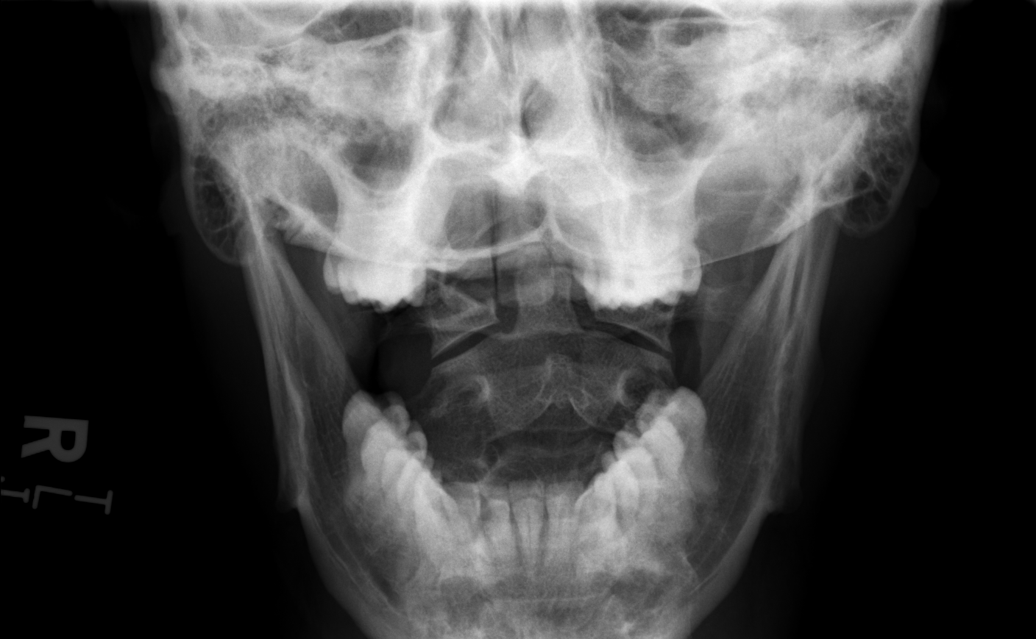

[w cervical spine odontoid (2 of 3)]
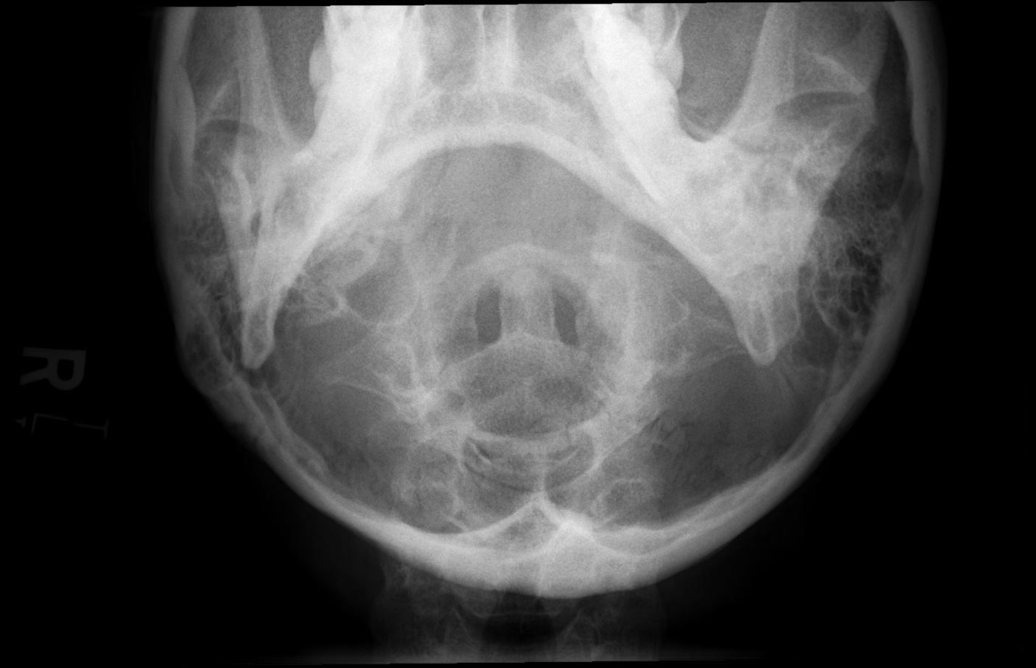

[w cervical spine odontoid (3 of 3)]
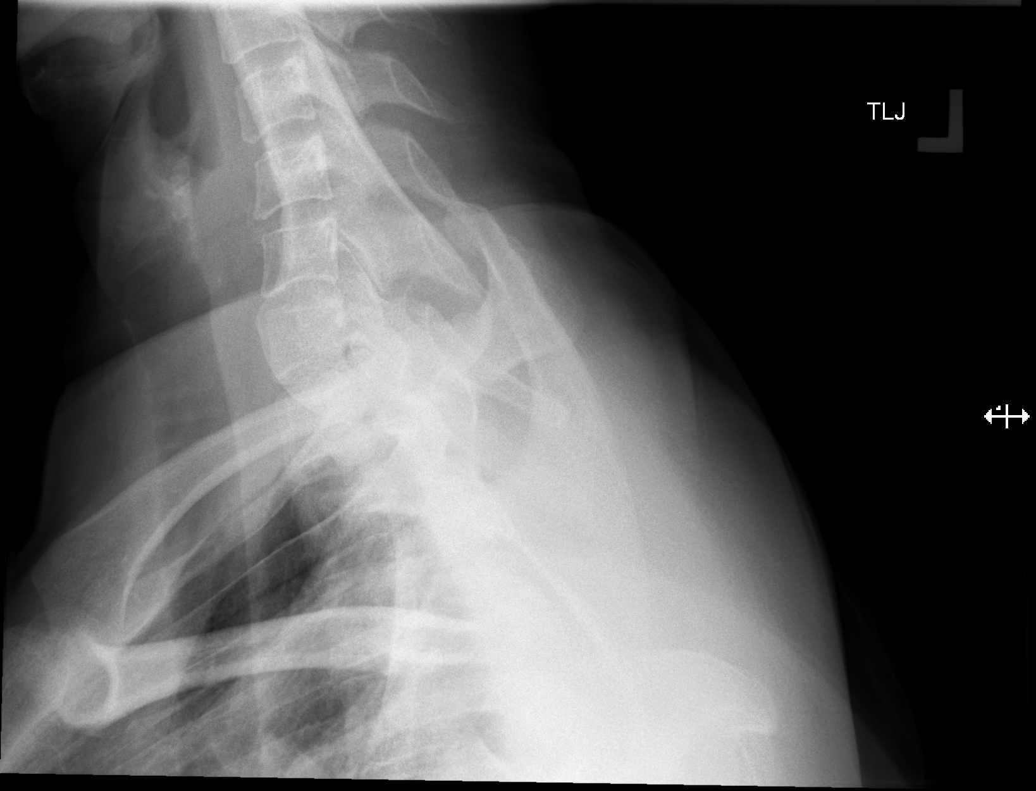

[7 of 7 positions shown; findings below may reference images not displayed]

FINDINGS: The cervical spine is imaged through the C7 vertebral body in the
lateral projection. Vertebral body heights are preserved. Alignment
is normal. The disc heights are preserved. The neural foramina
appear patent. The prevertebral soft tissues are unremarkable.
IMPRESSION: Normal cervical spine radiographs.

## 2023-11-17 MED ORDER — PREDNISONE 10 MG (21) PO TBPK
ORAL_TABLET | ORAL | 0 refills | Status: AC
  Filled 2023-11-17: qty 21, 6d supply, fill #0

## 2023-11-17 NOTE — Progress Notes (Signed)
 Message sent to patient requesting further input regarding current symptoms. Awaiting patient response.

## 2023-11-17 NOTE — Progress Notes (Signed)
 E Visit for Rash  We are sorry that you are not feeling well. Here is how we plan to help! Thank you for your additional responses.  Based on what you have shared, I am concerns for an allergic reaction, either due to improper handling of fish you had or potentially new medications recently started. Since it did not appear until after having the fish, I would say this is most likely culprit. I would also stop fish oils just for now. Keep hydrated. Cool compresses and OTC Benadryl  can be beneficial. I am adding on a short course of prednisone  to resolve this reaction ASAP. If you note any non-resolving, new, or worsening symptoms despite treatment, please seek an in-person evaluation ASAP.    GET HELP RIGHT AWAY IF:  Symptoms don't go away after treatment. Severe itching that persists. If you rash spreads or swells. If you rash begins to smell. If it blisters and opens or develops a yellow-brown crust. You develop a fever. You have a sore throat. You become short of breath.  MAKE SURE YOU:  Understand these instructions. Will watch your condition. Will get help right away if you are not doing well or get worse.  Thank you for choosing an e-visit.  Your e-visit answers were reviewed by a board certified advanced clinical practitioner to complete your personal care plan. Depending upon the condition, your plan could have included both over the counter or prescription medications.  Please review your pharmacy choice. Make sure the pharmacy is open so you can pick up prescription now. If there is a problem, you may contact your provider through [employer] and have the prescription routed to another pharmacy.  Your safety is important to us . If you have drug allergies check your prescription carefully.   For the next 24 hours you can use MyChart to ask questions about today's visit, request a non-urgent call back, or ask for a work or school excuse. You will get an email in the next  two days asking about your experience. I hope that your e-visit has been valuable and will speed your recovery.

## 2023-11-17 NOTE — Progress Notes (Signed)
 I have spent 5 minutes in review of e-visit questionnaire, review and updating patient chart, medical decision making and response to patient.   Elsie Velma Lunger, PA-C

## 2023-12-04 ENCOUNTER — Other Ambulatory Visit: Payer: Self-pay

## 2023-12-04 ENCOUNTER — Other Ambulatory Visit (HOSPITAL_COMMUNITY): Payer: Self-pay

## 2023-12-04 MED ORDER — THYROID 30 MG PO TABS
30.0000 mg | ORAL_TABLET | Freq: Every morning | ORAL | 1 refills | Status: AC
  Filled 2023-12-04 – 2023-12-07 (×3): qty 90, 90d supply, fill #0

## 2023-12-04 MED ORDER — METFORMIN HCL 500 MG PO TABS
500.0000 mg | ORAL_TABLET | Freq: Two times a day (BID) | ORAL | 1 refills | Status: AC
  Filled 2023-12-04: qty 180, 90d supply, fill #0

## 2023-12-05 ENCOUNTER — Other Ambulatory Visit: Payer: Self-pay

## 2023-12-05 ENCOUNTER — Encounter: Payer: Self-pay | Admitting: Pharmacist

## 2023-12-05 ENCOUNTER — Other Ambulatory Visit (HOSPITAL_COMMUNITY): Payer: Self-pay

## 2023-12-07 ENCOUNTER — Other Ambulatory Visit: Payer: Self-pay

## 2023-12-07 ENCOUNTER — Other Ambulatory Visit (HOSPITAL_COMMUNITY): Payer: Self-pay

## 2024-01-16 ENCOUNTER — Other Ambulatory Visit: Payer: Self-pay

## 2024-04-15 ENCOUNTER — Other Ambulatory Visit (HOSPITAL_COMMUNITY): Payer: Self-pay

## 2024-04-15 MED ORDER — NITROFURANTOIN MONOHYD MACRO 100 MG PO CAPS
100.0000 mg | ORAL_CAPSULE | Freq: Two times a day (BID) | ORAL | 0 refills | Status: AC
  Filled 2024-04-15: qty 14, 7d supply, fill #0

## 2024-04-15 MED ORDER — ALPRAZOLAM 0.5 MG PO TABS: 0.5000 mg | ORAL_TABLET | Freq: Every day | ORAL | 3 refills | Status: AC | PRN
